# Patient Record
Sex: Female | Born: 1965 | Race: White | Hispanic: No | Marital: Married | Smoking: Never smoker
Health system: Southern US, Community
[De-identification: ages and names within clinical notes are randomized; demographics above are authoritative.]

## PROBLEM LIST (undated history)

## (undated) DIAGNOSIS — F419 Anxiety disorder, unspecified: Secondary | ICD-10-CM

## (undated) DIAGNOSIS — M1990 Unspecified osteoarthritis, unspecified site: Secondary | ICD-10-CM

## (undated) DIAGNOSIS — M329 Systemic lupus erythematosus, unspecified: Secondary | ICD-10-CM

## (undated) DIAGNOSIS — IMO0002 Reserved for concepts with insufficient information to code with codable children: Secondary | ICD-10-CM

## (undated) DIAGNOSIS — R002 Palpitations: Secondary | ICD-10-CM

## (undated) HISTORY — DX: Unspecified osteoarthritis, unspecified site: M19.90

## (undated) HISTORY — DX: Anxiety disorder, unspecified: F41.9

## (undated) HISTORY — DX: Reserved for concepts with insufficient information to code with codable children: IMO0002

## (undated) HISTORY — DX: Systemic lupus erythematosus, unspecified: M32.9

## (undated) HISTORY — DX: Palpitations: R00.2

---

## 1992-04-02 HISTORY — PX: NASAL SINUS SURGERY: SHX719

## 2001-04-02 HISTORY — PX: OTHER SURGICAL HISTORY: SHX169

## 2005-09-26 ENCOUNTER — Ambulatory Visit (HOSPITAL_COMMUNITY): Admission: RE | Admit: 2005-09-26 | Discharge: 2005-09-26 | Payer: Self-pay | Admitting: Obstetrics and Gynecology

## 2005-09-27 ENCOUNTER — Encounter: Admission: RE | Admit: 2005-09-27 | Discharge: 2005-09-27 | Payer: Self-pay | Admitting: Obstetrics and Gynecology

## 2005-11-19 ENCOUNTER — Encounter: Admission: RE | Admit: 2005-11-19 | Discharge: 2005-11-19 | Payer: Self-pay | Admitting: Gastroenterology

## 2006-03-10 ENCOUNTER — Encounter: Admission: RE | Admit: 2006-03-10 | Discharge: 2006-03-10 | Payer: Self-pay | Admitting: Rheumatology

## 2006-11-06 ENCOUNTER — Encounter: Admission: RE | Admit: 2006-11-06 | Discharge: 2006-11-06 | Payer: Self-pay | Admitting: Obstetrics and Gynecology

## 2006-11-14 ENCOUNTER — Encounter: Admission: RE | Admit: 2006-11-14 | Discharge: 2006-11-14 | Payer: Self-pay | Admitting: Obstetrics and Gynecology

## 2007-06-14 ENCOUNTER — Emergency Department (HOSPITAL_COMMUNITY): Admission: EM | Admit: 2007-06-14 | Discharge: 2007-06-14 | Payer: Self-pay | Admitting: Emergency Medicine

## 2007-08-05 ENCOUNTER — Encounter: Admission: RE | Admit: 2007-08-05 | Discharge: 2007-08-05 | Payer: Self-pay | Admitting: Obstetrics and Gynecology

## 2007-12-11 ENCOUNTER — Encounter: Admission: RE | Admit: 2007-12-11 | Discharge: 2007-12-11 | Payer: Self-pay | Admitting: Rheumatology

## 2009-02-01 ENCOUNTER — Encounter: Payer: Self-pay | Admitting: Pulmonary Disease

## 2009-02-18 ENCOUNTER — Ambulatory Visit (HOSPITAL_COMMUNITY): Admission: RE | Admit: 2009-02-18 | Discharge: 2009-02-18 | Payer: Self-pay | Admitting: Rheumatology

## 2009-02-18 ENCOUNTER — Encounter: Payer: Self-pay | Admitting: Pulmonary Disease

## 2009-03-11 ENCOUNTER — Ambulatory Visit: Payer: Self-pay | Admitting: Pulmonary Disease

## 2009-03-11 DIAGNOSIS — R51 Headache: Secondary | ICD-10-CM

## 2009-03-11 DIAGNOSIS — M329 Systemic lupus erythematosus, unspecified: Secondary | ICD-10-CM

## 2009-03-11 DIAGNOSIS — R519 Headache, unspecified: Secondary | ICD-10-CM | POA: Insufficient documentation

## 2009-03-14 DIAGNOSIS — R930 Abnormal findings on diagnostic imaging of skull and head, not elsewhere classified: Secondary | ICD-10-CM | POA: Insufficient documentation

## 2010-01-26 ENCOUNTER — Ambulatory Visit (HOSPITAL_COMMUNITY): Admission: RE | Admit: 2010-01-26 | Discharge: 2010-01-26 | Payer: Self-pay | Admitting: Obstetrics and Gynecology

## 2010-03-01 ENCOUNTER — Encounter: Admission: RE | Admit: 2010-03-01 | Discharge: 2010-03-01 | Payer: Self-pay | Admitting: Obstetrics and Gynecology

## 2010-03-06 ENCOUNTER — Encounter: Admission: RE | Admit: 2010-03-06 | Discharge: 2010-03-06 | Payer: Self-pay | Admitting: Obstetrics and Gynecology

## 2010-04-24 ENCOUNTER — Encounter: Payer: Self-pay | Admitting: Obstetrics and Gynecology

## 2010-06-14 LAB — CBC
HCT: 35.5 % — ABNORMAL LOW (ref 36.0–46.0)
Hemoglobin: 12.4 g/dL (ref 12.0–15.0)
RBC: 3.58 MIL/uL — ABNORMAL LOW (ref 3.87–5.11)

## 2010-06-14 LAB — PREGNANCY, URINE: Preg Test, Ur: NEGATIVE

## 2010-12-25 LAB — COMPREHENSIVE METABOLIC PANEL
ALT: 16
AST: 18
Albumin: 4.2
Alkaline Phosphatase: 46
Calcium: 9.5
GFR calc Af Amer: >60
Glucose, Bld: 95
Potassium: 3.8
Sodium: 139
Total Protein: 7.1

## 2010-12-25 LAB — URINALYSIS, ROUTINE W REFLEX MICROSCOPIC
Nitrite: NEGATIVE
Specific Gravity, Urine: 1.018
Urobilinogen, UA: 0.2
pH: 7.5

## 2010-12-25 LAB — DIFFERENTIAL
Basophils Relative: 0
Eosinophils Absolute: 0.1
Eosinophils Relative: 1
Lymphs Abs: 1.5
Monocytes Absolute: 0.7
Monocytes Relative: 8
Neutrophils Relative %: 76

## 2010-12-25 LAB — CBC
Hemoglobin: 13.9
MCHC: 33.4
RBC: 4.44
RDW: 13.1

## 2010-12-25 LAB — PREGNANCY, URINE: Preg Test, Ur: NEGATIVE

## 2010-12-25 LAB — URINE MICROSCOPIC-ADD ON

## 2012-02-19 ENCOUNTER — Other Ambulatory Visit (HOSPITAL_COMMUNITY): Payer: Self-pay | Admitting: Cardiovascular Disease

## 2012-02-19 DIAGNOSIS — R079 Chest pain, unspecified: Secondary | ICD-10-CM

## 2012-02-26 ENCOUNTER — Ambulatory Visit (HOSPITAL_COMMUNITY)
Admission: RE | Admit: 2012-02-26 | Discharge: 2012-02-26 | Disposition: A | Discharge status: Home / Self Care | Payer: BC Managed Care – PPO | Source: Ambulatory Visit | Attending: Cardiovascular Disease | Admitting: Cardiovascular Disease

## 2012-02-26 DIAGNOSIS — R0789 Other chest pain: Secondary | ICD-10-CM | POA: Insufficient documentation

## 2012-02-26 DIAGNOSIS — R079 Chest pain, unspecified: Secondary | ICD-10-CM

## 2012-02-26 HISTORY — PX: TRANSTHORACIC ECHOCARDIOGRAM: SHX275

## 2012-02-26 NOTE — Progress Notes (Signed)
2D Echo Performed 02/26/2012    Julie Combs, RCS  

## 2012-03-05 ENCOUNTER — Encounter (HOSPITAL_COMMUNITY): Payer: Self-pay

## 2012-03-17 HISTORY — PX: OTHER SURGICAL HISTORY: SHX169

## 2012-11-27 ENCOUNTER — Other Ambulatory Visit: Payer: Self-pay | Admitting: Internal Medicine

## 2012-11-27 ENCOUNTER — Ambulatory Visit
Admission: RE | Admit: 2012-11-27 | Discharge: 2012-11-27 | Disposition: A | Discharge status: Home / Self Care | Payer: BC Managed Care – PPO | Source: Ambulatory Visit | Attending: Internal Medicine | Admitting: Internal Medicine

## 2012-11-27 DIAGNOSIS — S0990XA Unspecified injury of head, initial encounter: Secondary | ICD-10-CM

## 2014-04-15 ENCOUNTER — Telehealth: Payer: Self-pay | Admitting: Cardiovascular Disease

## 2014-04-15 ENCOUNTER — Ambulatory Visit (INDEPENDENT_AMBULATORY_CARE_PROVIDER_SITE_OTHER): Payer: BC Managed Care – PPO | Admitting: Family Medicine

## 2014-04-15 VITALS — BP 134/91 | HR 99 | Temp 98.1°F | Resp 16

## 2014-04-15 DIAGNOSIS — I4901 Ventricular fibrillation: Secondary | ICD-10-CM

## 2014-04-15 DIAGNOSIS — I498 Other specified cardiac arrhythmias: Secondary | ICD-10-CM

## 2014-04-15 DIAGNOSIS — I493 Ventricular premature depolarization: Secondary | ICD-10-CM

## 2014-04-15 LAB — COMPREHENSIVE METABOLIC PANEL
ALBUMIN: 4 g/dL (ref 3.5–5.2)
ALT: 12 U/L (ref 0–35)
AST: 15 U/L (ref 0–37)
Alkaline Phosphatase: 43 U/L (ref 39–117)
BUN: 21 mg/dL (ref 6–23)
CALCIUM: 9.3 mg/dL (ref 8.4–10.5)
CO2: 29 mEq/L (ref 19–32)
Chloride: 104 mEq/L (ref 96–112)
Creat: 0.67 mg/dL (ref 0.50–1.10)
Glucose, Bld: 98 mg/dL (ref 70–99)
POTASSIUM: 4.6 meq/L (ref 3.5–5.3)
Sodium: 139 mEq/L (ref 135–145)
Total Bilirubin: 0.4 mg/dL (ref 0.2–1.2)
Total Protein: 6.8 g/dL (ref 6.0–8.3)

## 2014-04-15 LAB — POCT CBC
GRANULOCYTE PERCENT: 68.9 % (ref 37–80)
HCT, POC: 40.9 % (ref 37.7–47.9)
HEMOGLOBIN: 13.1 g/dL (ref 12.2–16.2)
Lymph, poc: 1.3 (ref 0.6–3.4)
MCH: 29.7 pg (ref 27–31.2)
MCHC: 32.1 g/dL (ref 31.8–35.4)
MCV: 92.3 fL (ref 80–97)
MID (cbc): 0.3 (ref 0–0.9)
MPV: 7.7 fL (ref 0–99.8)
PLATELET COUNT, POC: 248 10*3/uL (ref 142–424)
POC Granulocyte: 3.7 (ref 2–6.9)
POC LYMPH PERCENT: 24.9 %L (ref 10–50)
POC MID %: 6.5 % (ref 0–12)
RBC: 4.43 M/uL (ref 4.04–5.48)
RDW, POC: 14.4 %
WBC: 5.4 10*3/uL (ref 4.6–10.2)

## 2014-04-15 LAB — GLUCOSE, POCT (MANUAL RESULT ENTRY): POC Glucose: 114 mg/dl — AB (ref 70–99)

## 2014-04-15 MED ORDER — ATENOLOL 25 MG PO TABS: 12.5000 mg | ORAL_TABLET | Freq: Every day | ORAL | Status: DC

## 2014-04-15 NOTE — Telephone Encounter (Signed)
Close encounter 

## 2014-04-15 NOTE — Patient Instructions (Addendum)
It appears that you are having "PVCs," these are harmless early heartbeats.  Please give your heart doctor a call (it looks like you have seen Dr. Gwenlyn Found in the past) and schedule an appointment: I will put in a referral for you since you have not been seen in a while.  I will be in touch with the rest of your labs asap.  Take care and please let me know if your symptoms get worse.  In the meantime try to get enough rest and limit caffeine or any other potential stimulants (sudafed, other cold medications)    [place] [facility]  [address]  [place], [place] [address]   Be sure that you stay well hydrated.   Take the atenolol 12.5 once a day- this will help to suppress your early heartbeats.    If you have chest pain, severe dizziness or any other problems go to the ER

## 2014-04-15 NOTE — Progress Notes (Addendum)
[facility] [address],  [place] [address] [phone]  Date:  04/15/2014   Name:  Julie Combs   DOB:  03/15/1966   MRN:  [id number]  PCP:  No primary care provider on file.    Chief Complaint: Irregular Heart Beat   History of Present Illness:  Julie Combs is a 49 y.o. very pleasant female patient who presents with the following:  She notes her "heart flutteing" over the last couple of days.  It is sometimes more noticeable and persistent, will come and go.  She is not having any chest pain, no syncope.  However she has felt a bit dizzy at times.    She had some heart concern and was seen by Dr. Gwenlyn Found a couple of years ago- she wore an event monitor and had an echo.  All was reassuring and she did not have to follow-up  She has lupus and uses plaquenil, arava.  OW she is generally in excellent health and is physically active.  No new meds, she has been using less caffiene than usual No SOB  LMP was in December- she is s/p BTL She has never been a smoker, here with her husband today   Patient Active Problem List   Diagnosis Date Noted  . Nonspecific (abnormal) findings on radiological and other examination of body structure 03/14/2009  . ABNORMAL CHEST XRAY 03/14/2009  . LUPUS 03/11/2009  . HEADACHE, CHRONIC 03/11/2009    Past Medical History  Diagnosis Date  . Anxiety   . Lupus     No past surgical history on file.  History  Substance Use Topics  . Smoking status: Never Smoker   . Smokeless tobacco: Not on file  . Alcohol Use: Not on file    No family history on file.  Allergies  Allergen Reactions  . Tetanus Toxoid     Medication list has been reviewed and updated.  No current outpatient prescriptions on file prior to visit.   No current facility-administered medications on file prior to visit.    Review of Systems:  As per HPI- otherwise negative.   Physical Examination: Filed Vitals:   04/15/14  1325  BP: 156/90  Pulse: 94  Temp: 98.1 F (36.7 C)  Resp: 16   There were no vitals filed for this visit. There is no weight on file to calculate BMI. Ideal Body Weight:    GEN: WDWN, NAD, Non-toxic, A & O x 3, slim, looks well HEENT: Atraumatic, Normocephalic. Neck supple. No masses, No LAD. Ears and Nose: No external deformity. CV: RRR with periods of frequent missed or early beats typical of PVCs, No M/G/R. No JVD. No thrill. No extra heart sounds. PULM: CTA B, no wheezes, crackles, rhonchi. No retractions. No resp. distress. No accessory muscle use. ABD: S, NT, ND, +BS. No rebound. No HSM. EXTR: No c/c/e NEURO Normal gait.  PSYCH: Normally interactive. Conversant. Not depressed or anxious appearing.  Calm demeanor.   EKG: 12 lead is normal, rhythm strip reveals several PVCs  Results for orders placed or performed in visit on 04/15/14  POCT CBC  Result Value Ref Range   WBC 5.4 4.6 - 10.2 K/uL   Lymph, poc 1.3 0.6 - 3.4   POC LYMPH PERCENT 24.9 10 - 50 %L   MID (cbc) 0.3 0 - 0.9   POC MID % 6.5 0 - 12 %M   POC Granulocyte 3.7 2 - 6.9   Granulocyte percent 68.9 37 -  80 %G   RBC 4.43 4.04 - 5.48 M/uL   Hemoglobin 13.1 12.2 - 16.2 g/dL   HCT, POC 40.9 37.7 - 47.9 %   MCV 92.3 80 - 97 fL   MCH, POC 29.7 27 - 31.2 pg   MCHC 32.1 31.8 - 35.4 g/dL   RDW, POC 14.4 %   Platelet Count, POC 248 142 - 424 K/uL   MPV 7.7 0 - 99.8 fL  POCT glucose (manual entry)  Result Value Ref Range   POC Glucose 114 (A) 70 - 99 mg/dl   See orthostatic values.  She is NOT fasting currently  Assessment and Plan: PVC (premature ventricular contraction)  Fluttering heart - Plan: EKG 12-Lead, POCT CBC, POCT glucose (manual entry), Comprehensive metabolic panel, TSH, atenolol (TENORMIN) 25 MG tablet, Ambulatory referral to Cardiology  Julie Combs is here today with palpitations due to PVCs.  She will call her cardiologist and schedule a follow-up appt, placed referral as she has not been seen  in a couple of years.  Start a low dose of atenolol to suppress her palpitations and await the rest of her labs.   She will seek care right away if any change in her sx  Signed Lamar Blinks, MD  1/15:Received labs, all normal.  Mail copy to pt, called and LMOM with this information

## 2014-04-16 ENCOUNTER — Encounter: Payer: Self-pay | Admitting: Family Medicine

## 2014-04-16 LAB — TSH: TSH: 0.898 u[IU]/mL (ref 0.350–4.500)

## 2014-04-30 ENCOUNTER — Encounter: Payer: Self-pay | Admitting: *Deleted

## 2014-05-04 ENCOUNTER — Ambulatory Visit (INDEPENDENT_AMBULATORY_CARE_PROVIDER_SITE_OTHER): Payer: BC Managed Care – PPO | Admitting: Cardiology

## 2014-05-04 ENCOUNTER — Encounter: Payer: Self-pay | Admitting: Cardiology

## 2014-05-04 VITALS — BP 112/74 | HR 75 | Ht 64.0 in | Wt 133.0 lb

## 2014-05-04 DIAGNOSIS — R002 Palpitations: Secondary | ICD-10-CM | POA: Diagnosis not present

## 2014-05-04 NOTE — Patient Instructions (Signed)
Julie Simmons, PA-C, recommends that you schedule a follow-up appointment in 4-5 weeks with Dr Gwenlyn Found.  Your physician has ordered you to wear a heart monitor. You will wear this for 30 days.   TIPS -  REMINDERS 1. The sensor is the lanyard that is worn around your neck every day - this is powered by a battery that needs to be changed every day 2. The monitor is the device that allows you to record symptoms - this will need to be charged daily 3. The sensor & monitor need to be within 100 feet of each other at all times 4. The sensor connects to the electrodes (stickers) - these should be changed every 24-48 hours (you do not have to remove them when you bathe, just make sure they are dry when you connect it back to the sensor 5. If you need more supplies (electrodes, batteries), please call the 1-800 # on the back of the pamphlet and [employer] will mail you more supplies 6. If your skin becomes sensitive, please try the sample pack of sensitive skin electrodes (the white packet in your silver box) and call [employer] to have them mail you more of these type of electrodes 7. When you are finish wearing the monitor, please place all supplies back in the silver box, place the silver box in the pre-packaged UPS bag and drop off at [employer] or call them so they can come pick it up   Cardiac Event Monitoring A cardiac event monitor is a small recording device used to help detect abnormal heart rhythms (arrhythmias). The monitor is used to record heart rhythm when noticeable symptoms such as the following occur:  Fast heartbeats (palpitations), such as heart racing or fluttering.  Dizziness.  Fainting or light-headedness.  Unexplained weakness. The monitor is wired to two electrodes placed on your chest. Electrodes are flat, sticky disks that attach to your skin. The monitor can be worn for up to 30 days. You will wear the monitor at all times, except when bathing.  HOW TO USE YOUR CARDIAC EVENT  MONITOR A technician will prepare your chest for the electrode placement. The technician will show you how to place the electrodes, how to work the monitor, and how to replace the batteries. Take time to practice using the monitor before you leave the office. Make sure you understand how to send the information from the monitor to your health care provider. This requires a telephone with a landline, not a cell phone. You need to:  Wear your monitor at all times, except when you are in water:  Do not get the monitor wet.  Take the monitor off when bathing. Do not swim or use a hot tub with it on.  Keep your skin clean. Do not put body lotion or moisturizer on your chest.  Change the electrodes daily or any time they stop sticking to your skin. You might need to use tape to keep them on.  It is possible that your skin under the electrodes could become irritated. To keep this from happening, try to put the electrodes in slightly different places on your chest. However, they must remain in the area under your left breast and in the upper right section of your chest.  Make sure the monitor is safely clipped to your clothing or in a location close to your body that your health care provider recommends.  Press the button to record when you feel symptoms of heart trouble, such as dizziness, weakness, light-headedness,  palpitations, thumping, shortness of breath, unexplained weakness, or a fluttering or racing heart. The monitor is always on and records what happened slightly before you pressed the button, so do not worry about being too late to get good information.  Keep a diary of your activities, such as walking, doing chores, and taking medicine. It is especially important to note what you were doing when you pushed the button to record your symptoms. This will help your health care provider determine what might be contributing to your symptoms. The information stored in your monitor will be reviewed  by your health care provider alongside your diary entries.  Send the recorded information as recommended by your health care provider. It is important to understand that it will take some time for your health care provider to process the results.  Change the batteries as recommended by your health care provider. SEEK IMMEDIATE MEDICAL CARE IF:   You have chest pain.  You have extreme difficulty breathing or shortness of breath.  You develop a very fast heartbeat that persists.  You develop dizziness that does not go away.  You faint or constantly feel you are about to faint. Document Released: 12/27/2007 Document Revised: 08/03/2013 Document Reviewed: 09/15/2012 [facility] Patient Information 2015 [place], [place]. This information is not intended to replace advice given to you by your health care provider. Make sure you discuss any questions you have with your health care provider.

## 2014-05-04 NOTE — Progress Notes (Signed)
Cardiology Office Note   Date:  05/04/2014   ID:  Julie Combs, DOB 1966/02/16, MRN [id number]  PCP:  Horatio Pel, MD  Cardiologist:  Dr. Gwenlyn Found  Chief Complaint  Patient presents with  . Palpitations    has improved since urgent care added atenolol      History of Present Illness: Julie Combs is a 49 y.o. female who presents to clinic for evaluation for palpitations. She has been seen in the past by Dr. Gwenlyn Found. She was seen in 2012 for evaluation of palpitations. Cardiac monitor during that time revealed no significant findings other than benign PVCs. Stress test and 2-D echocardiogram during that time also revealed no ischemia and normal systolic function. Her past medical history is significant for lupus with mostly skin and articular manifestations. She denies any awareness of any other organ involvement.   She was recently evaluated at an urgent care on 04/15/13 for palpitations and was noted to have PVCs on EKG. She reports noticing increased recurrence of palpitations. She reports her current symptoms are different from the palpitations she experienced in 2012 when she was evaluated by Dr. Gwenlyn Found. Her most recent episodes have been more constant and have lasted several hours. She notes some shortness of breath but denies dizziness, lightheadedness, syncope/near-syncope. No chest discomfort. She denies any recent consumption of excessice caffeine or alcohol. No over-the-counter medicines or stimulants. At the urgent care, TSH was checked and was normal. She was prescribed atenolol. Since starting the medication she has noticed some improvement with reduced symptoms but continues to have occasional recurrences of palpitations.    Past Medical History  Diagnosis Date  . Anxiety   . Lupus     Past Surgical History  Procedure Laterality Date  . Transthoracic echocardiogram  02/26/12    EF 55%-60%.NORMAL STUDY.  Marland Kitchen Cpx  03/17/12    FUNCTIONAL  STATUS:[id number] VO2=75% OF PREDICTED.CV STATUS:IMPAIRED.SUBOPTIMAL PEAK CV STRESS LOAD.LOW AT IS SUGGESTIVE OF IMPAIRED CARDIAC FUNCTION.PULMONARY STATUS:NOT IMPAIRED.LIMITING FACTOR=CARDIAC + EFFORT.     Current Outpatient Prescriptions  Medication Sig Dispense Refill  . atenolol (TENORMIN) 25 MG tablet Take 0.5 tablets (12.5 mg total) by mouth daily. 30 tablet 1  . citalopram (CELEXA) 20 MG tablet Take 20 mg by mouth daily.    . hydroxychloroquine (PLAQUENIL) 200 MG tablet Take by mouth daily.    Marland Kitchen leflunomide (ARAVA) 20 MG tablet Take 20 mg by mouth daily.     No current facility-administered medications for this visit.    Allergies:   Tetanus toxoid    Social History:  The patient  reports that she has never smoked. She does not have any smokeless tobacco history on file.   Family History:  The patient's family history is significant for atrial fibrillation in grandmother  ROS:  Please see the history of present illness.   Otherwise, review of systems are positive for palpitations.   All other systems are reviewed and negative.    PHYSICAL EXAM: VS:  BP 112/74 mmHg  Pulse 75  Ht 5\' 4"  (1.626 m)  Wt 133 lb (60.328 kg)  BMI 22.82 kg/m2  LMP 03/02/2014 (Approximate) , BMI Body mass index is 22.82 kg/(m^2). GEN: Well nourished, well developed, in no acute distress HEENT: normal Neck: no JVD, carotid bruits, or masses Cardiac: RRR; no murmurs, rubs, or gallops,no edema  Respiratory:  clear to auscultation bilaterally, normal work of breathing GI: soft, nontender, nondistended, + BS MS: no deformity or atrophy Skin: warm and dry, no rash Neuro:  Strength and sensation are intact Psych: euthymic mood, full affect   EKG:  EKG is ordered today. The ekg ordered today demonstrates NSR with sinus arrhthymias. HR 75 bpm    Recent Labs: 04/15/2014: ALT 12; BUN 21; Creatinine 0.67; Hemoglobin 13.1; Potassium 4.6; Sodium 139; TSH 0.898    Lipid Panel No results found for:  CHOL, TRIG, HDL, CHOLHDL, VLDL, LDLCALC, LDLDIRECT    Wt Readings from Last 3 Encounters:  05/04/14 133 lb (60.328 kg)  03/11/09 127 lb (57.607 kg)      Other studies Reviewed: Additional studies/ records that were reviewed today include: prior 2D echo. Recent thyroid studies Review of the above records demonstrates: normal past cardiac studies/ normal thyroid function test   ASSESSMENT AND PLAN:  1.  Palpitations: Recent EKG at urgent care revealed sinus rhythm with frequent PVCs. TSH was checked and was also normal. She has noticed moderate improvement with atenolol but continues to have occasional episodes of recurrent palpitations. Her EKG today demonstrates arhythmia. Rate is  75 bpm. She was reassured that PVCs are benign. However, she has concerns regarding her lupus and the possibility for cardiac involvement. For reassurance, she wishes to be further evaluated with cardiac event monitoring to ensure that no serious arrhythmias are occurring. We will evaluate her with a 30 day heart monitor. She has been advised at to refrain from triggers including excessive caffeine or alcohol intake. Follow-up with Dr. Gwenlyn Found in 4-5 weeks for reassessment and to review cardiac monitor results.    Current medicines are reviewed at length with the patient today.  The patient does not have concerns regarding medicines.  The following changes have been made:  Will order 30 day event monitor  Labs/ tests ordered today include: Ambulatory cardiac event monitor    Disposition:   FU with Dr. Gwenlyn Found in 5 weeks   Signed, Lyda Jester, PA-C  05/04/2014 [address], [place], [place]  [address] Phone: [phone]; Fax: [phone]

## 2014-05-28 ENCOUNTER — Telehealth: Payer: Self-pay | Admitting: Cardiovascular Disease

## 2014-05-28 NOTE — Telephone Encounter (Signed)
Pt said that she has sensitive skin stickers and her skin is still extremely irritated. She has a raised, red rash. She said the rash and irritation is keeping her up at night. She wants to know if we have enough information. Pt states she has taken the monitor off and cannot continue to wear it because of the irritation, itching and rash.

## 2014-05-28 NOTE — Telephone Encounter (Signed)
Altha Harm is calling because she has been wearing a 30 monitor and it has given her a horrible rash and she does not have it on today . Wants to know if she can stop wearing it . Please call   Thanks

## 2014-06-03 ENCOUNTER — Telehealth: Payer: Self-pay | Admitting: *Deleted

## 2014-06-03 NOTE — Telephone Encounter (Signed)
Per [employer] report, patient was to finish monitor on 06/02/14. Assume she continued to wear monitor despite the skin irritation?  Patient also has OV with Dr. Gwenlyn Found 3/16 and 3/18 - may need to clarify

## 2014-06-03 NOTE — Telephone Encounter (Signed)
Contacted patient because she has 2 appointments scheduled to see Dr. Gwenlyn Found.  Spoke to patient and she stated that she only had the one appointment on the 18th written down, and that was best for her.  I will make sure her 3/16 appointment is cancelled.

## 2014-06-03 NOTE — Telephone Encounter (Signed)
Pt unable to wear event monitor. PA who ordered the monitor is aware. Pt has appointment with Dr. Gwenlyn Found on 3/18. 3/16 appt has been cancelled.

## 2014-06-16 ENCOUNTER — Ambulatory Visit: Payer: BC Managed Care – PPO | Admitting: Cardiovascular Disease

## 2014-06-18 ENCOUNTER — Encounter: Payer: Self-pay | Admitting: Cardiovascular Disease

## 2014-06-18 ENCOUNTER — Ambulatory Visit (INDEPENDENT_AMBULATORY_CARE_PROVIDER_SITE_OTHER): Payer: BC Managed Care – PPO | Admitting: Cardiovascular Disease

## 2014-06-18 VITALS — BP 118/76 | HR 80 | Ht 64.0 in | Wt 136.7 lb

## 2014-06-18 DIAGNOSIS — R002 Palpitations: Secondary | ICD-10-CM | POA: Insufficient documentation

## 2014-06-18 NOTE — Patient Instructions (Signed)
Follow up with Dr Berry as needed.  

## 2014-06-18 NOTE — Progress Notes (Signed)
06/18/2014 Julie Combs   February 26, 1966  [id number]  Primary Physician Horatio Pel, MD Primary Cardiologist: Lorretta Harp MD Julie Combs   HPI:  Mrs. 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. I initially saw her back in 2013 for palpitations. She is a primary care  patient of Dr. Katrine Coho  at [facility]. She has had a normal 2-D echo and exercise stress test back in 2013. She has been complaining of increasing frequency of palpitations back in late January or early February and saw Julie Henri PA-C in our practice for evaluation. A event monitor showed unifocal PVCs. She was placed on low-dose beta blocker which she has since discontinued. Palpitations have somewhat improved. She does admit to drinking one cup of caffeinated coffee a day. Her history otherwise is remarkable for SLE on suppressive therapy.   Current Outpatient Prescriptions  Medication Sig Dispense Refill  . atenolol (TENORMIN) 25 MG tablet Take 0.5 tablets (12.5 mg total) by mouth daily. 30 tablet 1  . citalopram (CELEXA) 20 MG tablet Take 20 mg by mouth daily.    . hydroxychloroquine (PLAQUENIL) 200 MG tablet Take by mouth daily.    Marland Kitchen leflunomide (ARAVA) 20 MG tablet Take 20 mg by mouth daily.     No current facility-administered medications for this visit.    Allergies  Allergen Reactions  . Tetanus Toxoid     Swelling, high fever    History   Social History  . Marital Status: Married    Spouse Name: N/A  . Number of Children: N/A  . Years of Education: N/A   Occupational History  . Not on file.   Social History Main Topics  . Smoking status: Never Smoker   . Smokeless tobacco: Not on file  . Alcohol Use: Not on file  . Drug Use: Not on file  . Sexual Activity: Not on file   Other Topics Concern  . Not on file   Social History Narrative      Review of Systems: General: negative for chills, fever, night sweats or weight changes.  Cardiovascular: negative for chest pain, dyspnea on exertion, edema, orthopnea, palpitations, paroxysmal nocturnal dyspnea or shortness of breath Dermatological: negative for rash Respiratory: negative for cough or wheezing Urologic: negative for hematuria Abdominal: negative for nausea, vomiting, diarrhea, bright red blood per rectum, melena, or hematemesis Neurologic: negative for visual changes, syncope, or dizziness All other systems reviewed and are otherwise negative except as noted above.    Blood pressure 118/76, pulse 80, height 5\' 4"  (1.626 m), weight 136 lb 11.2 oz (62.007 kg).  General appearance: alert and no distress Neck: no adenopathy, no carotid bruit, no JVD, supple, symmetrical, trachea midline and thyroid not enlarged, symmetric, no tenderness/mass/nodules Lungs: clear to auscultation bilaterally Heart: regular rate and rhythm, S1, S2 normal, no murmur, click, rub or gallop Extremities: extremities normal, atraumatic, no cyanosis or edema  EKG not performed today  ASSESSMENT AND PLAN:   Palpitations Patient has a long history of palpitations dating back to her first office visit with me in 2013. At that time an exercise stress test and 2-D echo were normal. He does have a history of systemic lupus erythematosus which is relatively quiescent. Recent event monitor showed unifocal PVCs which improved somewhat with low-dose beta blocker which she has since discontinued. Her thyroid function tests apparently were normal. She does drink one  cup of caffeinated coffee a day. Her palpitations have gotten somewhat better since she saw [place] PA-C 6 weeks ago. I reassured her that these are benign with no prognostic significance. She prefers to be off of beta blocker. I will see her back when necessary.       Lorretta Harp MD FACP,FACC,FAHA, [facility] 06/18/2014 10:59 AM

## 2014-06-18 NOTE — Assessment & Plan Note (Signed)
Patient has a long history of palpitations dating back to her first office visit with me in 2013. At that time an exercise stress test and 2-D echo were normal. He does have a history of systemic lupus erythematosus which is relatively quiescent. Recent event monitor showed unifocal PVCs which improved somewhat with low-dose beta blocker which she has since discontinued. Her thyroid function tests apparently were normal. She does drink one cup of caffeinated coffee a day. Her palpitations have gotten somewhat better since she saw [place] PA-C 6 weeks ago. I reassured her that these are benign with no prognostic significance. She prefers to be off of beta blocker. I will see her back when necessary.

## 2014-12-10 ENCOUNTER — Ambulatory Visit (INDEPENDENT_AMBULATORY_CARE_PROVIDER_SITE_OTHER): Payer: BC Managed Care – PPO | Admitting: Podiatry

## 2014-12-10 ENCOUNTER — Encounter: Payer: Self-pay | Admitting: Podiatry

## 2014-12-10 ENCOUNTER — Ambulatory Visit (INDEPENDENT_AMBULATORY_CARE_PROVIDER_SITE_OTHER): Payer: BC Managed Care – PPO

## 2014-12-10 VITALS — BP 89/56 | HR 63 | Resp 16 | Ht 64.0 in | Wt 132.0 lb

## 2014-12-10 DIAGNOSIS — M79672 Pain in left foot: Secondary | ICD-10-CM | POA: Diagnosis not present

## 2014-12-10 DIAGNOSIS — M25571 Pain in right ankle and joints of right foot: Secondary | ICD-10-CM

## 2014-12-10 DIAGNOSIS — M779 Enthesopathy, unspecified: Secondary | ICD-10-CM | POA: Diagnosis not present

## 2014-12-10 MED ORDER — TRIAMCINOLONE ACETONIDE 10 MG/ML IJ SUSP
10.0000 mg | Freq: Once | INTRAMUSCULAR | Status: AC
  Administered 2014-12-10: 10 mg

## 2014-12-10 NOTE — Patient Instructions (Addendum)
Achilles Tendinitis  with Rehab Achilles tendinitis is a disorder of the Achilles tendon. The Achilles tendon connects the large calf muscles (Gastrocnemius and Soleus) to the heel bone (calcaneus). This tendon is sometimes called the heel cord. It is important for pushing-off and standing on your toes and is important for walking, running, or jumping. Tendinitis is often caused by overuse and repetitive microtrauma. SYMPTOMS  Pain, tenderness, swelling, warmth, and redness may occur over the Achilles tendon even at rest.  Pain with pushing off, or flexing or extending the ankle.  Pain that is worsened after or during activity. CAUSES   Overuse sometimes seen with rapid increase in exercise programs or in sports requiring running and jumping.  Poor physical conditioning (strength and flexibility or endurance).  Running sports, especially training running down hills.  Inadequate warm-up before practice or play or failure to stretch before participation.  Injury to the tendon. PREVENTION   Warm up and stretch before practice or competition.  Allow time for adequate rest and recovery between practices and competition.  Keep up conditioning.  Keep up ankle and leg flexibility.  Improve or keep muscle strength and endurance.  Improve cardiovascular fitness.  Use proper technique.  Use proper equipment (shoes, skates).  To help prevent recurrence, taping, protective strapping, or an adhesive bandage may be recommended for several weeks after healing is complete. PROGNOSIS   Recovery may take weeks to several months to heal.  Longer recovery is expected if symptoms have been prolonged.  Recovery is usually quicker if the inflammation is due to a direct blow as compared with overuse or sudden strain. RELATED COMPLICATIONS   Healing time will be prolonged if the condition is not correctly treated. The injury must be given plenty of time to heal.  Symptoms can reoccur if  activity is resumed too soon.  Untreated, tendinitis may increase the risk of tendon rupture requiring additional time for recovery and possibly surgery. TREATMENT   The first treatment consists of rest anti-inflammatory medication, and ice to relieve the pain.  Stretching and strengthening exercises after resolution of pain will likely help reduce the risk of recurrence. Referral to a physical therapist or athletic trainer for further evaluation and treatment may be helpful.  A walking boot or cast may be recommended to rest the Achilles tendon. This can help break the cycle of inflammation and microtrauma.  Arch supports (orthotics) may be prescribed or recommended by your caregiver as an adjunct to therapy and rest.  Surgery to remove the inflamed tendon lining or degenerated tendon tissue is rarely necessary and has shown less than predictable results. MEDICATION   Nonsteroidal anti-inflammatory medications, such as aspirin and ibuprofen, may be used for pain and inflammation relief. Do not take within 7 days before surgery. Take these as directed by your caregiver. Contact your caregiver immediately if any bleeding, stomach upset, or signs of allergic reaction occur. Other minor pain relievers, such as acetaminophen, may also be used.  Pain relievers may be prescribed as necessary by your caregiver. Do not take prescription pain medication for longer than 4 to 7 days. Use only as directed and only as much as you need.  Cortisone injections are rarely indicated. Cortisone injections may weaken tendons and predispose to rupture. It is better to give the condition more time to heal than to use them. HEAT AND COLD  Cold is used to relieve pain and reduce inflammation for acute and chronic Achilles tendinitis. Cold should be applied for 10 to 15 minutes   every 2 to 3 hours for inflammation and pain and immediately after any activity that aggravates your symptoms. Use ice packs or an ice  massage.  Heat may be used before performing stretching and strengthening activities prescribed by your caregiver. Use a heat pack or a warm soak. SEEK MEDICAL CARE IF:  Symptoms get worse or do not improve in 2 weeks despite treatment.  New, unexplained symptoms develop. Drugs used in treatment may produce side effects.  EXERCISES:  RANGE OF MOTION (ROM) AND STRETCHING EXERCISES - Achilles Tendinitis  These exercises may help you when beginning to rehabilitate your injury. Your symptoms may resolve with or without further involvement from your physician, physical therapist or athletic trainer. While completing these exercises, remember:   Restoring tissue flexibility helps normal motion to return to the joints. This allows healthier, less painful movement and activity.  An effective stretch should be held for at least 30 seconds.  A stretch should never be painful. You should only feel a gentle lengthening or release in the stretched tissue.  STRETCH  Gastroc, Standing   Place hands on wall.  Extend right / left leg, keeping the front knee somewhat bent.  Slightly point your toes inward on your back foot.  Keeping your right / left heel on the floor and your knee straight, shift your weight toward the wall, not allowing your back to arch.  You should feel a gentle stretch in the right / left calf. Hold this position for 10 seconds. Repeat 3 times. Complete this stretch 2 times per day.  STRETCH  Soleus, Standing   Place hands on wall.  Extend right / left leg, keeping the other knee somewhat bent.  Slightly point your toes inward on your back foot.  Keep your right / left heel on the floor, bend your back knee, and slightly shift your weight over the back leg so that you feel a gentle stretch deep in your back calf.  Hold this position for 10 seconds. Repeat 3 times. Complete this stretch 2 times per day.  STRETCH  Gastrocsoleus, Standing  Note: This exercise can place  a lot of stress on your foot and ankle. Please complete this exercise only if specifically instructed by your caregiver.   Place the ball of your right / left foot on a step, keeping your other foot firmly on the same step.  Hold on to the wall or a rail for balance.  Slowly lift your other foot, allowing your body weight to press your heel down over the edge of the step.  You should feel a stretch in your right / left calf.  Hold this position for 10 seconds.  Repeat this exercise with a slight bend in your knee. Repeat 3 times. Complete this stretch 2 times per day.   STRENGTHENING EXERCISES - Achilles Tendinitis These exercises may help you when beginning to rehabilitate your injury. They may resolve your symptoms with or without further involvement from your physician, physical therapist or athletic trainer. While completing these exercises, remember:   Muscles can gain both the endurance and the strength needed for everyday activities through controlled exercises.  Complete these exercises as instructed by your physician, physical therapist or athletic trainer. Progress the resistance and repetitions only as guided.  You may experience muscle soreness or fatigue, but the pain or discomfort you are trying to eliminate should never worsen during these exercises. If this pain does worsen, stop and make certain you are following the directions exactly. If   the pain is still present after adjustments, discontinue the exercise until you can discuss the trouble with your clinician.  STRENGTH - Plantar-flexors   Sit with your right / left leg extended. Holding onto both ends of a rubber exercise band/tubing, loop it around the ball of your foot. Keep a slight tension in the band.  Slowly push your toes away from you, pointing them downward.  Hold this position for 10 seconds. Return slowly, controlling the tension in the band/tubing. Repeat 3 times. Complete this exercise 2 times per day.     STRENGTH - Plantar-flexors   Stand with your feet shoulder width apart. Steady yourself with a wall or table using as little support as needed.  Keeping your weight evenly spread over the width of your feet, rise up on your toes.*  Hold this position for 10 seconds. Repeat 3 times. Complete this exercise 2 times per day.  *If this is too easy, shift your weight toward your right / left leg until you feel challenged. Ultimately, you may be asked to do this exercise with your right / left foot only.  STRENGTH  Plantar-flexors, Eccentric  Note: This exercise can place a lot of stress on your foot and ankle. Please complete this exercise only if specifically instructed by your caregiver.   Place the balls of your feet on a step. With your hands, use only enough support from a wall or rail to keep your balance.  Keep your knees straight and rise up on your toes.  Slowly shift your weight entirely to your right / left toes and pick up your opposite foot. Gently and with controlled movement, lower your weight through your right / left foot so that your heel drops below the level of the step. You will feel a slight stretch in the back of your calf at the end position.  Use the healthy leg to help rise up onto the balls of both feet, then lower weight only on the right / left leg again. Build up to 15 repetitions. Then progress to 3 consecutive sets of 15 repetitions.*  After completing the above exercise, complete the same exercise with a slight knee bend (about 30 degrees). Again, build up to 15 repetitions. Then progress to 3 consecutive sets of 15 repetitions.* Perform this exercise 2 times per day.  *When you easily complete 3 sets of 15, your physician, physical therapist or athletic trainer may advise you to add resistance by wearing a backpack filled with additional weight.  STRENGTH - Plantar Flexors, Seated   Sit on a chair that allows your feet to rest flat on the ground. If  necessary, sit at the edge of the chair.  Keeping your toes firmly on the ground, lift your right / left heel as far as you can without increasing any discomfort in your ankle. Repeat 3 times. Complete this exercise 2 times a day. Achilles Tendinitis  with Rehab Achilles tendinitis is a disorder of the Achilles tendon. The Achilles tendon connects the large calf muscles (Gastrocnemius and Soleus) to the heel bone (calcaneus). This tendon is sometimes called the heel cord. It is important for pushing-off and standing on your toes and is important for walking, running, or jumping. Tendinitis is often caused by overuse and repetitive microtrauma. SYMPTOMS  Pain, tenderness, swelling, warmth, and redness may occur over the Achilles tendon even at rest.  Pain with pushing off, or flexing or extending the ankle.  Pain that is worsened after or during activity. CAUSES     Overuse sometimes seen with rapid increase in exercise programs or in sports requiring running and jumping.  Poor physical conditioning (strength and flexibility or endurance).  Running sports, especially training running down hills.  Inadequate warm-up before practice or play or failure to stretch before participation.  Injury to the tendon. PREVENTION   Warm up and stretch before practice or competition.  Allow time for adequate rest and recovery between practices and competition.  Keep up conditioning.  Keep up ankle and leg flexibility.  Improve or keep muscle strength and endurance.  Improve cardiovascular fitness.  Use proper technique.  Use proper equipment (shoes, skates).  To help prevent recurrence, taping, protective strapping, or an adhesive bandage may be recommended for several weeks after healing is complete. PROGNOSIS   Recovery may take weeks to several months to heal.  Longer recovery is expected if symptoms have been prolonged.  Recovery is usually quicker if the inflammation is due to a  direct blow as compared with overuse or sudden strain. RELATED COMPLICATIONS   Healing time will be prolonged if the condition is not correctly treated. The injury must be given plenty of time to heal.  Symptoms can reoccur if activity is resumed too soon.  Untreated, tendinitis may increase the risk of tendon rupture requiring additional time for recovery and possibly surgery. TREATMENT   The first treatment consists of rest anti-inflammatory medication, and ice to relieve the pain.  Stretching and strengthening exercises after resolution of pain will likely help reduce the risk of recurrence. Referral to a physical therapist or athletic trainer for further evaluation and treatment may be helpful.  A walking boot or cast may be recommended to rest the Achilles tendon. This can help break the cycle of inflammation and microtrauma.  Arch supports (orthotics) may be prescribed or recommended by your caregiver as an adjunct to therapy and rest.  Surgery to remove the inflamed tendon lining or degenerated tendon tissue is rarely necessary and has shown less than predictable results. MEDICATION   Nonsteroidal anti-inflammatory medications, such as aspirin and ibuprofen, may be used for pain and inflammation relief. Do not take within 7 days before surgery. Take these as directed by your caregiver. Contact your caregiver immediately if any bleeding, stomach upset, or signs of allergic reaction occur. Other minor pain relievers, such as acetaminophen, may also be used.  Pain relievers may be prescribed as necessary by your caregiver. Do not take prescription pain medication for longer than 4 to 7 days. Use only as directed and only as much as you need.  Cortisone injections are rarely indicated. Cortisone injections may weaken tendons and predispose to rupture. It is better to give the condition more time to heal than to use them. HEAT AND COLD  Cold is used to relieve pain and reduce  inflammation for acute and chronic Achilles tendinitis. Cold should be applied for 10 to 15 minutes every 2 to 3 hours for inflammation and pain and immediately after any activity that aggravates your symptoms. Use ice packs or an ice massage.  Heat may be used before performing stretching and strengthening activities prescribed by your caregiver. Use a heat pack or a warm soak. SEEK MEDICAL CARE IF:  Symptoms get worse or do not improve in 2 weeks despite treatment.  New, unexplained symptoms develop. Drugs used in treatment may produce side effects.  EXERCISES:  RANGE OF MOTION (ROM) AND STRETCHING EXERCISES - Achilles Tendinitis  These exercises may help you when beginning to rehabilitate your injury. Your   symptoms may resolve with or without further involvement from your physician, physical therapist or athletic trainer. While completing these exercises, remember:   Restoring tissue flexibility helps normal motion to return to the joints. This allows healthier, less painful movement and activity.  An effective stretch should be held for at least 30 seconds.  A stretch should never be painful. You should only feel a gentle lengthening or release in the stretched tissue.  STRETCH  Gastroc, Standing   Place hands on wall.  Extend right / left leg, keeping the front knee somewhat bent.  Slightly point your toes inward on your back foot.  Keeping your right / left heel on the floor and your knee straight, shift your weight toward the wall, not allowing your back to arch.  You should feel a gentle stretch in the right / left calf. Hold this position for 10 seconds. Repeat 3 times. Complete this stretch 2 times per day.  STRETCH  Soleus, Standing   Place hands on wall.  Extend right / left leg, keeping the other knee somewhat bent.  Slightly point your toes inward on your back foot.  Keep your right / left heel on the floor, bend your back knee, and slightly shift your weight  over the back leg so that you feel a gentle stretch deep in your back calf.  Hold this position for 10 seconds. Repeat 3 times. Complete this stretch 2 times per day.  STRETCH  Gastrocsoleus, Standing  Note: This exercise can place a lot of stress on your foot and ankle. Please complete this exercise only if specifically instructed by your caregiver.   Place the ball of your right / left foot on a step, keeping your other foot firmly on the same step.  Hold on to the wall or a rail for balance.  Slowly lift your other foot, allowing your body weight to press your heel down over the edge of the step.  You should feel a stretch in your right / left calf.  Hold this position for 10 seconds.  Repeat this exercise with a slight bend in your knee. Repeat 3 times. Complete this stretch 2 times per day.   STRENGTHENING EXERCISES - Achilles Tendinitis These exercises may help you when beginning to rehabilitate your injury. They may resolve your symptoms with or without further involvement from your physician, physical therapist or athletic trainer. While completing these exercises, remember:   Muscles can gain both the endurance and the strength needed for everyday activities through controlled exercises.  Complete these exercises as instructed by your physician, physical therapist or athletic trainer. Progress the resistance and repetitions only as guided.  You may experience muscle soreness or fatigue, but the pain or discomfort you are trying to eliminate should never worsen during these exercises. If this pain does worsen, stop and make certain you are following the directions exactly. If the pain is still present after adjustments, discontinue the exercise until you can discuss the trouble with your clinician.  STRENGTH - Plantar-flexors   Sit with your right / left leg extended. Holding onto both ends of a rubber exercise band/tubing, loop it around the ball of your foot. Keep a slight  tension in the band.  Slowly push your toes away from you, pointing them downward.  Hold this position for 10 seconds. Return slowly, controlling the tension in the band/tubing. Repeat 3 times. Complete this exercise 2 times per day.   STRENGTH - Plantar-flexors   Stand with your feet   shoulder width apart. Steady yourself with a wall or table using as little support as needed.  Keeping your weight evenly spread over the width of your feet, rise up on your toes.*  Hold this position for 10 seconds. Repeat 3 times. Complete this exercise 2 times per day.  *If this is too easy, shift your weight toward your right / left leg until you feel challenged. Ultimately, you may be asked to do this exercise with your right / left foot only.  STRENGTH  Plantar-flexors, Eccentric  Note: This exercise can place a lot of stress on your foot and ankle. Please complete this exercise only if specifically instructed by your caregiver.   Place the balls of your feet on a step. With your hands, use only enough support from a wall or rail to keep your balance.  Keep your knees straight and rise up on your toes.  Slowly shift your weight entirely to your right / left toes and pick up your opposite foot. Gently and with controlled movement, lower your weight through your right / left foot so that your heel drops below the level of the step. You will feel a slight stretch in the back of your calf at the end position.  Use the healthy leg to help rise up onto the balls of both feet, then lower weight only on the right / left leg again. Build up to 15 repetitions. Then progress to 3 consecutive sets of 15 repetitions.*  After completing the above exercise, complete the same exercise with a slight knee bend (about 30 degrees). Again, build up to 15 repetitions. Then progress to 3 consecutive sets of 15 repetitions.* Perform this exercise 2 times per day.  *When you easily complete 3 sets of 15, your physician,  physical therapist or athletic trainer may advise you to add resistance by wearing a backpack filled with additional weight.  STRENGTH - Plantar Flexors, Seated   Sit on a chair that allows your feet to rest flat on the ground. If necessary, sit at the edge of the chair.  Keeping your toes firmly on the ground, lift your right / left heel as far as you can without increasing any discomfort in your ankle. Repeat 3 times. Complete this exercise 2 times a day.  

## 2014-12-10 NOTE — Progress Notes (Signed)
   Subjective:    Patient ID: Julie Combs, female    DOB: Feb 14, 1966, 49 y.o.   MRN: [id number]  HPI Patient presents with bilateral foot pain. On Left foot-back of heel and arch, x6 month. On Right foot-medial side-in June twisted foot.   Review of Systems  Constitutional: Positive for fatigue.  HENT: Positive for sinus pressure and tinnitus.   Cardiovascular: Positive for palpitations.  Musculoskeletal: Positive for myalgias.  Hematological: Bruises/bleeds easily.  All other systems reviewed and are negative.      Objective:   Physical Exam        Assessment & Plan:

## 2014-12-13 NOTE — Progress Notes (Signed)
Subjective:     Patient ID: Julie Combs, female   DOB: 1965-10-13, 49 y.o.   MRN: [id number]  HPI patient presents stating I'm having a lot of pain in the bottom of my left heel and I'm also getting pain on the inside of my right ankle. States the heels been hurting for at least 6 months and the ankle for the last several months   Review of Systems  All other systems reviewed and are negative.      Objective:   Physical Exam  Constitutional: She is oriented to person, place, and time.  Cardiovascular: Intact distal pulses.   Musculoskeletal: Normal range of motion.  Neurological: She is oriented to person, place, and time.  Skin: Skin is warm.  Nursing note and vitals reviewed.  neurovascular status found to be intact with muscle strength adequate range of motion within normal limits. Patient does have good digital perfusion is well oriented with moderate depression of the arch bilateral. I noted there to be exquisite discomfort left plantar fascia at the insertional point tendon into the calcaneus and moderate discomfort in the posterior tibial tendon near its insertion into the navicular on the right foot. I did not note any muscle strength loss upon testing     Assessment:     Acute plantar fasciitis left with probable posterior tibial tendinitis right secondary to sprained ankle and probable gait change from heel pain    Plan:     H&P and x-rays of both feet discussed with patient. Today I injected the left plantar fascia 3 mg Kenalog 5 mg Xylocaine and along the sheath the posterior tibial tendon right 3 mg dexamethasone Kenalog 5 mg Xylocaine and applied fascial brace bilateral. Gave instructions on physical therapy and reappoint to recheck

## 2014-12-24 ENCOUNTER — Ambulatory Visit (INDEPENDENT_AMBULATORY_CARE_PROVIDER_SITE_OTHER): Payer: BC Managed Care – PPO | Admitting: Podiatry

## 2014-12-24 ENCOUNTER — Encounter: Payer: Self-pay | Admitting: Podiatry

## 2014-12-24 VITALS — BP 113/71 | HR 82 | Resp 16

## 2014-12-24 DIAGNOSIS — M779 Enthesopathy, unspecified: Secondary | ICD-10-CM | POA: Diagnosis not present

## 2014-12-24 DIAGNOSIS — M79672 Pain in left foot: Secondary | ICD-10-CM

## 2014-12-26 NOTE — Progress Notes (Signed)
Subjective:     Patient ID: Julie Combs, female   DOB: 27-Mar-1966, 49 y.o.   MRN: [id number]  HPI patient states I'm still having pain but improved from where it was previously but still difficult to do extended type of activity   Review of Systems     Objective:   Physical Exam Neurovascular status muscle strength adequate with continued discomfort in the plantar fascia bilateral at the insertional point of the tendon into the calcaneus    Assessment:     Plantar fasciitis bilateral still present    Plan:     Advised on physical therapy supportive shoe and scanned for a Berkley type orthotic to give complete support to the heel arch and forefoot. Reappoint to recheck

## 2015-01-14 ENCOUNTER — Ambulatory Visit (INDEPENDENT_AMBULATORY_CARE_PROVIDER_SITE_OTHER): Payer: BC Managed Care – PPO | Admitting: Podiatry

## 2015-01-14 ENCOUNTER — Encounter: Payer: Self-pay | Admitting: Podiatry

## 2015-01-14 VITALS — BP 130/87 | HR 90 | Resp 12

## 2015-01-14 DIAGNOSIS — M722 Plantar fascial fibromatosis: Secondary | ICD-10-CM | POA: Diagnosis not present

## 2015-01-14 DIAGNOSIS — M76821 Posterior tibial tendinitis, right leg: Secondary | ICD-10-CM | POA: Diagnosis not present

## 2015-01-14 NOTE — Patient Instructions (Signed)

## 2015-01-16 NOTE — Progress Notes (Signed)
Subjective:     Patient ID: Julie Combs, female   DOB: 1965/10/05, 49 y.o.   MRN: [id number]  HPI patient states my feet are not hurting any were near as bad and I'm excited to get my orthotics   Review of Systems     Objective:   Physical Exam Neurovascular status intact muscle strength adequate range of motion within normal limits with patient's feet continuing to improve from plantar fascial symptoms left and tendinitis right with minimal discomfort upon palpation    Assessment:     Improving plantar fasciitis left tendinitis right    Plan:     Reviewed conditions and at this time recommended physical therapy to be continued supportive shoes and dispensed orthotics with instructions on usage. Will be seen back if symptoms persist or get worse

## 2015-11-05 ENCOUNTER — Ambulatory Visit (INDEPENDENT_AMBULATORY_CARE_PROVIDER_SITE_OTHER): Payer: BC Managed Care – PPO

## 2015-11-05 ENCOUNTER — Ambulatory Visit (INDEPENDENT_AMBULATORY_CARE_PROVIDER_SITE_OTHER): Payer: BC Managed Care – PPO | Admitting: Osteopathic Medicine

## 2015-11-05 VITALS — BP 122/78 | HR 99 | Temp 99.0°F | Resp 12 | Ht 64.0 in | Wt 136.0 lb

## 2015-11-05 DIAGNOSIS — R05 Cough: Secondary | ICD-10-CM

## 2015-11-05 DIAGNOSIS — B9789 Other viral agents as the cause of diseases classified elsewhere: Secondary | ICD-10-CM

## 2015-11-05 DIAGNOSIS — R059 Cough, unspecified: Secondary | ICD-10-CM

## 2015-11-05 DIAGNOSIS — B349 Viral infection, unspecified: Secondary | ICD-10-CM | POA: Diagnosis not present

## 2015-11-05 DIAGNOSIS — M329 Systemic lupus erythematosus, unspecified: Secondary | ICD-10-CM

## 2015-11-05 DIAGNOSIS — J988 Other specified respiratory disorders: Secondary | ICD-10-CM

## 2015-11-05 MED ORDER — NAPROXEN 500 MG PO TABS: 250.0000 mg | ORAL_TABLET | Freq: Two times a day (BID) | ORAL | 0 refills | Status: DC

## 2015-11-05 MED ORDER — BENZONATATE 200 MG PO CAPS: 200.0000 mg | ORAL_CAPSULE | Freq: Three times a day (TID) | ORAL | 0 refills | Status: DC | PRN

## 2015-11-05 MED ORDER — GUAIFENESIN-CODEINE 100-10 MG/5ML PO SYRP: 5.0000 mL | ORAL_SOLUTION | Freq: Three times a day (TID) | ORAL | 0 refills | Status: DC | PRN

## 2015-11-05 MED ORDER — FLUTICASONE-SALMETEROL 115-21 MCG/ACT IN AERO: 2.0000 | INHALATION_SPRAY | Freq: Two times a day (BID) | RESPIRATORY_TRACT | 0 refills | Status: DC

## 2015-11-05 NOTE — Patient Instructions (Addendum)
  Most likely your symptoms can be explained by post viral cough syndrome or a viral bronchitis or other respiratory infection, which typically clears up within 2-3 weeks. However, given your history of lupus, need to make sure that this cough is resolving as expected and that there are no concerns for pulmonary complications associated with lupus.   We can treat symptomatically with the guaifenesin-codeine/Robitussin-AC cough syrup and benzonatate/Tessalon oral medication, can add on steroid inhaler if no improvement on the cough medicines as noted above after a few days. Can take naproxen for pain. If your pain symptoms worsen, or if shortness of breath worsens, please return for repeat chest x-ray, and at that point we may need to consider steroid therapy or other further evaluation based on your symptoms and on your physical exam.   If severe shortness of breath, particularly if any dizziness, chest pain, fever/chills, or other concerns - would recommend evaluation in the emergency room ASAP.   IF you received an x-ray today, you will receive an invoice from [facility]. Please contact [facility]iology at [phone] with questions or concerns regarding your invoice.   IF you received labwork today, you will receive an invoice from [employer]. Please contact [facility] at [phone] with questions or concerns regarding your invoice.   Our billing staff will not be able to assist you with questions regarding bills from these companies.  You will be contacted with the lab results as soon as they are available. The fastest way to get your results is to activate your My Chart account. Instructions are located on the last page of this paperwork. If you have not heard from [place] regarding the results in 2 weeks, please contact this office.

## 2015-11-05 NOTE — Progress Notes (Signed)
HPI: Julie Combs is a 50 y.o. female who presents to [facility]  Today 11/05/15 for chief complaint of:  Chief Complaint  Patient presents with  . Shortness of Breath    1 day  . Cough  . Headache    COUGH/SOB, Pleuritic Pain . Context:  Was also sick last weekend with fever and sore throat, better by wednesday . Quality/Location: chest congestion/tightness . Assoc signs/symptoms: see ROS . Duration: 1 days . Modifying factors: has tried the following OTC/Rx medications: none   Past medical, social and family history reviewed. Current medications and allergies reviewed.     Review of Systems: CONSTITUTIONAL: no fever/chills HEAD/EYES/EARS/NOSE/THROAT: no headache, no vision change or hearing change, no sore throat CARDIAC: No chest pain, No pressure/palpitations RESPIRATORY: yes cough, no shortness of breath GASTROINTESTINAL: no nausea, no vomiting, no abdominal pain, no diarrhea MUSCULOSKELETAL: no myalgia/arthralgia SKIN: no rash  Exam:  BP 122/78   Pulse 99   Temp 99 F (37.2 C) (Oral)   Resp 12   Ht 5\' 4"  (1.626 m)   Wt 136 lb (61.7 kg)   SpO2 100%   BMI 23.34 kg/m  Constitutional: VSS, see above. General Appearance: alert, well-developed, well-nourished, NAD Eyes: Normal lids and conjunctive, non-icteric sclera, PERRLA Ears, Nose, Mouth, Throat: Normal external inspection ears/nares/mouth/lips/gums, normal TM, MMM;       posterior pharynx without erythema, without exudate, nasal mucosa normal Neck: No masses, trachea m idline. normal lymph nodes Respiratory: Normal respiratory effort. No  wheeze/rhonchi/rales. No crackles on exam, no hypoxemia concerning for Lupus Pneumonitis. No audible rub concerning for pleural disease.  Cardiovascular: S1/S2 normal, no murmur/rub/gallop auscultated. RRR.    Dg Chest 2 View  Result Date: 11/05/2015 CLINICAL DATA:  Cough. EXAM: CHEST  2 VIEW COMPARISON:  February 18, 2009 FINDINGS: The heart  size and mediastinal contours are within normal limits. Both lungs are clear. The visualized skeletal structures are unremarkable. IMPRESSION: No active cardiopulmonary disease. Electronically Signed   By: Dorise Bullion III M.D   On: 11/05/2015 14:39   CXR personally reviewed - no other concerns    ASSESSMENT/PLAN:  Cough - Plan: DG Chest 2 View, benzonatate (TESSALON) 200 MG capsule, guaiFENesin-codeine (ROBITUSSIN AC) 100-10 MG/5ML syrup, DISCONTINUED: guaiFENesin-codeine (ROBITUSSIN AC) 100-10 MG/5ML syrup  SLE (systemic lupus erythematosus) ([place])  Viral respiratory illness    Visit summary was printed for the patient with medications and pertinent instructions for patient to review. ER/RTC precautions reviewed. All questions answered. Return if symptoms worsen or fail to improve, and as directed by rheumatology/PCP for routine care.

## 2017-02-01 ENCOUNTER — Encounter: Payer: Self-pay | Admitting: Internal Medicine

## 2017-04-09 ENCOUNTER — Ambulatory Visit (AMBULATORY_SURGERY_CENTER): Payer: Self-pay

## 2017-04-09 VITALS — Ht 64.0 in | Wt 131.6 lb

## 2017-04-09 DIAGNOSIS — Z1211 Encounter for screening for malignant neoplasm of colon: Secondary | ICD-10-CM

## 2017-04-09 MED ORDER — NA SULFATE-K SULFATE-MG SULF 17.5-3.13-1.6 GM/177ML PO SOLN: 1.0000 | Freq: Once | ORAL | 0 refills | Status: AC

## 2017-04-09 NOTE — Progress Notes (Signed)
Per pt, no allergies to soy or egg products.Pt not taking any weight loss meds or using  O2 at home.  Pt refused emmi video. 

## 2017-04-10 ENCOUNTER — Encounter: Payer: Self-pay | Admitting: Internal Medicine

## 2017-04-23 ENCOUNTER — Other Ambulatory Visit: Payer: Self-pay

## 2017-04-23 ENCOUNTER — Ambulatory Visit (AMBULATORY_SURGERY_CENTER): Payer: BC Managed Care – PPO | Admitting: Internal Medicine

## 2017-04-23 ENCOUNTER — Encounter: Payer: Self-pay | Admitting: Internal Medicine

## 2017-04-23 VITALS — BP 115/73 | HR 61 | Temp 98.0°F | Resp 12 | Ht 64.0 in | Wt 131.0 lb

## 2017-04-23 DIAGNOSIS — D123 Benign neoplasm of transverse colon: Secondary | ICD-10-CM

## 2017-04-23 DIAGNOSIS — Z1212 Encounter for screening for malignant neoplasm of rectum: Secondary | ICD-10-CM | POA: Diagnosis not present

## 2017-04-23 DIAGNOSIS — Z1211 Encounter for screening for malignant neoplasm of colon: Secondary | ICD-10-CM

## 2017-04-23 DIAGNOSIS — K635 Polyp of colon: Secondary | ICD-10-CM

## 2017-04-23 MED ORDER — SODIUM CHLORIDE 0.9 % IV SOLN: 500.0000 mL | Freq: Once | INTRAVENOUS | Status: AC

## 2017-04-23 NOTE — Op Note (Signed)
[place] Patient Name: Julie Combs Procedure Date: 04/23/2017 9:07 AM MRN: [id number] Endoscopist: Jerene Bears , MD Age: 52 Referring MD:  Date of Birth: [birth year] Gender: Female Account #: [id number] Procedure:                Colonoscopy Indications:              Screening for colorectal malignant neoplasm, This                            is the patient's first colonoscopy Medicines:                Monitored Anesthesia Care Procedure:                Pre-Anesthesia Assessment:                           - Prior to the procedure, a History and Physical                            was performed, and patient medications and                            allergies were reviewed. The patient's tolerance of                            previous anesthesia was also reviewed. The risks                            and benefits of the procedure and the sedation                            options and risks were discussed with the patient.                            All questions were answered, and informed consent                            was obtained. Prior Anticoagulants: The patient has                            taken no previous anticoagulant or antiplatelet                            agents. ASA Grade Assessment: II - A patient with                            mild systemic disease. After reviewing the risks                            and benefits, the patient was deemed in                            satisfactory condition to undergo the procedure.  After obtaining informed consent, the colonoscope                            was passed under direct vision. Throughout the                            procedure, the patient's blood pressure, pulse, and                            oxygen saturations were monitored continuously. The                            Colonoscope was introduced through the anus and                            advanced to the the  cecum, identified by                            appendiceal orifice and ileocecal valve. The                            colonoscopy was performed without difficulty. The                            patient tolerated the procedure well. The quality                            of the bowel preparation was good. The ileocecal                            valve, appendiceal orifice, and rectum were                            photographed. Scope In: 9:15:29 AM Scope Out: 9:28:18 AM Scope Withdrawal Time: 0 hours 10 minutes 4 seconds  Total Procedure Duration: 0 hours 12 minutes 49 seconds  Findings:                 The digital rectal exam was normal.                           A 3 mm polyp was found in the transverse colon. The                            polyp was sessile. The polyp was removed with a                            cold snare. Resection and retrieval were complete.                           The exam was otherwise without abnormality on                            direct and retroflexion views. Complications:  No immediate complications. Estimated Blood Loss:     Estimated blood loss was minimal. Impression:               - One 3 mm polyp in the transverse colon, removed                            with a cold snare. Resected and retrieved.                           - The examination was otherwise normal on direct                            and retroflexion views. Recommendation:           - Patient has a contact number available for                            emergencies. The signs and symptoms of potential                            delayed complications were discussed with the                            patient. Return to normal activities tomorrow.                            Written discharge instructions were provided to the                            patient.                           - Resume previous diet.                           - Continue present medications.                            - Await pathology results.                           - Repeat colonoscopy is recommended. The                            colonoscopy date will be determined after pathology                            results from today's exam become available for                            review. Jerene Bears, MD 04/23/2017 9:30:59 AM This report has been signed electronically.

## 2017-04-23 NOTE — Progress Notes (Signed)
Pt's states no medical or surgical changes since previsit or office visit. 

## 2017-04-23 NOTE — Progress Notes (Signed)
Report given to PACU, vss 

## 2017-04-23 NOTE — Progress Notes (Signed)
Called to room to assist during endoscopic procedure.  Patient ID and intended procedure confirmed with present staff. Received instructions for my participation in the procedure from the performing physician.  

## 2017-04-23 NOTE — Patient Instructions (Signed)
YOU HAD AN ENDOSCOPIC PROCEDURE TODAY AT THE [facility]:   Refer to the procedure report that was given to you for any specific questions about what was found during the examination.  If the procedure report does not answer your questions, please call your gastroenterologist to clarify.  If you requested that your care partner not be given the details of your procedure findings, then the procedure report has been included in a sealed envelope for you to review at your convenience later.  YOU SHOULD EXPECT: Some feelings of bloating in the abdomen. Passage of more gas than usual.  Walking can help get rid of the air that was put into your GI tract during the procedure and reduce the bloating. If you had a lower endoscopy (such as a colonoscopy or flexible sigmoidoscopy) you may notice spotting of blood in your stool or on the toilet paper. If you underwent a bowel prep for your procedure, you may not have a normal bowel movement for a few days.  Please Note:  You might notice some irritation and congestion in your nose or some drainage.  This is from the oxygen used during your procedure.  There is no need for concern and it should clear up in a day or so.  SYMPTOMS TO REPORT IMMEDIATELY:   Following lower endoscopy (colonoscopy or flexible sigmoidoscopy):  Excessive amounts of blood in the stool  Significant tenderness or worsening of abdominal pains  Swelling of the abdomen that is new, acute  Fever of 100F or higher   For urgent or emergent issues, a gastroenterologist can be reached at any hour by calling [phone].   DIET:  We do recommend a small meal at first, but then you may proceed to your regular diet.  Drink plenty of fluids but you should avoid alcoholic beverages for 24 hours.  ACTIVITY:  You should plan to take it easy for the rest of today and you should NOT DRIVE or use heavy machinery until tomorrow (because of the sedation medicines used during the test).     FOLLOW UP: Our staff will call the number listed on your records the next business day following your procedure to check on you and address any questions or concerns that you may have regarding the information given to you following your procedure. If we do not reach you, we will leave a message.  However, if you are feeling well and you are not experiencing any problems, there is no need to return our call.  We will assume that you have returned to your regular daily activities without incident.  If any biopsies were taken you will be contacted by phone or by letter within the next 1-3 weeks.  Please call us at [phone] if you have not heard about the biopsies in 3 weeks.    SIGNATURES/CONFIDENTIALITY: You and/or your care partner have signed paperwork which will be entered into your electronic medical record.  These signatures attest to the fact that that the information above on your After Visit Summary has been reviewed and is understood.  Full responsibility of the confidentiality of this discharge information lies with you and/or your care-partner.  Polyp information given. 

## 2017-04-24 ENCOUNTER — Telehealth: Payer: Self-pay

## 2017-04-24 NOTE — Telephone Encounter (Signed)
  Follow up Call-  Call back number 04/23/2017  Post procedure Call Back phone  # [phone]  Permission to leave phone message Yes  Some recent data might be hidden     Patient questions:  Do you have a fever, pain , or abdominal swelling? No. Pain Score  0 *  Have you tolerated food without any problems? Yes.    Have you been able to return to your normal activities? Yes.    Do you have any questions about your discharge instructions: Diet   No. Medications  No. Follow up visit  No.  Do you have questions or concerns about your Care? No.  Actions: * If pain score is 4 or above: No action needed, pain <4.

## 2017-04-26 ENCOUNTER — Encounter: Payer: Self-pay | Admitting: Internal Medicine

## 2017-06-12 ENCOUNTER — Ambulatory Visit: Payer: Self-pay | Admitting: Emergency Medicine

## 2017-06-12 ENCOUNTER — Encounter: Payer: Self-pay | Admitting: Emergency Medicine

## 2017-06-12 VITALS — BP 130/80 | HR 113 | Temp 100.3°F | Wt 131.2 lb

## 2017-06-12 DIAGNOSIS — R6889 Other general symptoms and signs: Secondary | ICD-10-CM

## 2017-06-12 LAB — POCT INFLUENZA A/B
INFLUENZA A, POC: NEGATIVE
Influenza B, POC: NEGATIVE

## 2017-06-12 NOTE — Patient Instructions (Signed)

## 2017-06-12 NOTE — Progress Notes (Signed)
S: Julie Combs is a 52 y.o. female who presents for evaluation of headahce, fever, and neck stiffness. Headache is a dull ache, no light sensitivity, described as throbbing, not worsened with movement. Symptoms have been on going for two days. Not relieved with OTC therapies. No hx of recent travel, past hx of lupus.  Review of Systems  Constitutional: Positive for chills, fever and malaise/fatigue.  HENT: Positive for congestion. Negative for sinus pain and sore throat.   Respiratory: Negative for cough.   Cardiovascular: Negative for chest pain and palpitations.  Gastrointestinal: Negative for diarrhea, nausea and vomiting.  Musculoskeletal: Positive for myalgias and neck pain.  Neurological: Positive for headaches. Negative for dizziness.   O: Vitals:   06/12/17 1940  BP: 130/80  Pulse: (!) 113  Temp: 100.3 F (37.9 C)  SpO2: 98%   Physical Exam  Constitutional: She appears well-developed and well-nourished. No distress.  HENT:  Head: Normocephalic and atraumatic.  Right Ear: External ear normal.  Left Ear: External ear normal.  Mouth/Throat: Oropharynx is clear and moist.  Eyes: Conjunctivae are normal.  Neck: Normal range of motion. Neck supple. No neck rigidity. Normal range of motion present. No Brudzinski's sign and no Kernig's sign noted.  Cardiovascular: Regular rhythm. Tachycardia present.  Pulmonary/Chest: Effort normal and breath sounds normal.  Abdominal: Soft.  Lymphadenopathy:    She has no cervical adenopathy.  Skin: Skin is warm. Capillary refill takes less than 2 seconds. She is not diaphoretic.  Nursing note and vitals reviewed.   A: 1. Flu-like symptoms     P: Flu negative, no meningeal signs or concerning history with headache. Conservative treatments with OTC therapies, counseling to go to the ER for worsening symptoms.

## 2017-06-18 ENCOUNTER — Ambulatory Visit: Payer: BC Managed Care – PPO | Admitting: Physician Assistant

## 2017-06-18 ENCOUNTER — Encounter: Payer: Self-pay | Admitting: Physician Assistant

## 2017-06-18 ENCOUNTER — Other Ambulatory Visit: Payer: Self-pay

## 2017-06-18 VITALS — BP 128/82 | HR 82 | Temp 98.7°F | Resp 18 | Ht 64.0 in | Wt 129.0 lb

## 2017-06-18 DIAGNOSIS — H6692 Otitis media, unspecified, left ear: Secondary | ICD-10-CM

## 2017-06-18 DIAGNOSIS — H9203 Otalgia, bilateral: Secondary | ICD-10-CM

## 2017-06-18 MED ORDER — AMOXICILLIN-POT CLAVULANATE 875-125 MG PO TABS: 1.0000 | ORAL_TABLET | Freq: Two times a day (BID) | ORAL | 0 refills | Status: DC

## 2017-06-18 NOTE — Progress Notes (Signed)
Julie Combs  MRN: [id number] DOB: 1965-12-12  PCP: Deland Pretty, MD  Subjective:  Pt is a 52 year old female who presents to clinic for ear pain. L>R. "feels full" started yesterday. "felt like someone was stabbing me in the ear".  She was seen at primary care office 6 days ago c/o HA, fever, and chills x 2 days. Negative flu test. Dx with viral upper resperatory infection and treated conservatively.  Her symptoms were gradually improving over the past week, however significantly worsened last night with "stabbing "ear pain.  She has not taken anything to feel better.  She took Robitussin last night for her cough, this helped her sleep  Review of Systems  Constitutional: Negative for chills, diaphoresis, fatigue and fever.  HENT: Positive for ear pain. Negative for congestion, ear discharge, postnasal drip, rhinorrhea, sinus pressure, sinus pain and sore throat.   Respiratory: Negative for cough, shortness of breath and wheezing.   Psychiatric/Behavioral: Negative for sleep disturbance.    Patient Active Problem List   Diagnosis Date Noted  . Palpitations 06/18/2014  . Nonspecific (abnormal) findings on radiological and other examination of body structure 03/14/2009  . ABNORMAL CHEST XRAY 03/14/2009  . LUPUS 03/11/2009  . HEADACHE, CHRONIC 03/11/2009    Current Outpatient Medications on File Prior to Visit  Medication Sig Dispense Refill  . citalopram (CELEXA) 20 MG tablet Take 20 mg by mouth daily.    Marland Kitchen ibuprofen (ADVIL,MOTRIN) 400 MG tablet Take 400 mg by mouth as needed.    Marland Kitchen VITAMIN D, CHOLECALCIFEROL, PO Take 5,000 Units by mouth daily.     . Doxylamine Succinate, Sleep, (SLEEP AID PO) Take by mouth as needed.    . leflunomide (ARAVA) 20 MG tablet Take 20 mg by mouth daily.    . naproxen (NAPROSYN) 500 MG tablet Take 0.5-1 tablets (250-500 mg total) by mouth 2 (two) times daily with a meal. As needed for pain (Patient not taking: Reported on 04/09/2017) 40 tablet 0    Current Facility-Administered Medications on File Prior to Visit  Medication Dose Route Frequency Provider Last Rate Last Dose  . 0.9 %  sodium chloride infusion  500 mL Intravenous Once Pyrtle, Lajuan Lines, MD        Allergies  Allergen Reactions  . Tetanus Toxoid     Swelling, high fever     Objective:  BP 128/82 (BP Location: Left Arm, Patient Position: Sitting, Cuff Size: Normal)   Pulse 82   Temp 98.7 F (37.1 C) (Oral)   Resp 18   Ht 5\' 4"  (1.626 m)   Wt 129 lb (58.5 kg)   SpO2 99%   BMI 22.14 kg/m   Physical Exam  Constitutional: She is oriented to person, place, and time and well-developed, well-nourished, and in no distress. No distress.  HENT:  Right Ear: Tympanic membrane normal. Tympanic membrane is not erythematous, not retracted and not bulging.  Left Ear: No tenderness. No mastoid tenderness. Tympanic membrane is injected and bulging. Tympanic membrane is not perforated. A middle ear effusion is present.  Nose: Mucosal edema present. No rhinorrhea. Right sinus exhibits no maxillary sinus tenderness and no frontal sinus tenderness. Left sinus exhibits no maxillary sinus tenderness and no frontal sinus tenderness.  Mouth/Throat: Oropharynx is clear and moist and mucous membranes are normal.  Cardiovascular: Normal rate, regular rhythm and normal heart sounds.  Pulmonary/Chest: Effort normal and breath sounds normal. No respiratory distress. She has no wheezes. She has no rales.  Neurological: She is  alert and oriented to person, place, and time. GCS score is 15.  Skin: Skin is warm and dry.  Psychiatric: Mood, memory, affect and judgment normal.  Vitals reviewed.   Assessment and Plan :  1. Left otitis media, unspecified otitis media type 2. Otalgia of both ears - amoxicillin-clavulanate (AUGMENTIN) 875-125 MG tablet; Take 1 tablet by mouth 2 (two) times daily.  Dispense: 14 tablet; Refill: 0 -Patient endorses recent viral illness with worsening symptoms last  night.  Physical exam suggests otitis media plan to treat. RTC if no improvement in 5-7 days.   Mercer Pod, PA-C  [facility] 06/18/2017 12:18 PM

## 2017-06-18 NOTE — Patient Instructions (Addendum)
Take augmentin twice a day for the next 7 days. Finish the ENTIRE COURSE of antibiotics, even if you feel better sooner.  Stay well hydrated - drink 1-2 liters of water daily.  Avoid dairy for the next 5-7 days - dairy can increase your mucus production.  An antihistamine may help reduce inflammation - try claritin-D.  Come back if you are not improving in 7-10 days.   Thank you for coming in today. I hope you feel we met your needs.  Feel free to call PCP if you have any questions or further requests.  Please consider signing up for MyChart if you do not already have it, as this is a great way to communicate with me.  Best,  Whitney McVey, PA-C   We recommend that you schedule a mammogram for breast cancer screening. Typically, you do not need a referral to do this. Please contact a local imaging center to schedule your mammogram.  [facility] - [phone]  *ask for the Radiology Department [facility] ([place]) - [phone] or [phone]  [facility] - [phone] [place] [phone] [facility] - [phone]  *ask for the [facility] - [phone]  *ask for the Radiology Department [facility] - [phone]  *ask for the [facility] - [phone]    IF you received an x-ray today, you will receive an invoice from [facility]iology. Please contact [facility] Radiology at [phone] with questions or concerns regarding your invoice.   IF you received labwork today, you will receive an invoice from [place] [facility]. Please contact [facility] at [phone] with questions or concerns regarding your invoice.   Our billing staff will not be able to assist you with questions regarding bills from these companies.  You will be contacted with the lab results as soon as they are available. The fastest way to get your results is to  activate your My Chart account. Instructions are located on the last page of this paperwork. If you have not heard from [place] regarding the results in 2 weeks, please contact this office.

## 2018-05-16 ENCOUNTER — Ambulatory Visit (HOSPITAL_COMMUNITY)
Admission: EM | Admit: 2018-05-16 | Discharge: 2018-05-16 | Disposition: A | Discharge status: Home / Self Care | Payer: BC Managed Care – PPO | Attending: Internal Medicine | Admitting: Internal Medicine

## 2018-05-16 ENCOUNTER — Encounter (HOSPITAL_COMMUNITY): Payer: Self-pay

## 2018-05-16 DIAGNOSIS — R0789 Other chest pain: Secondary | ICD-10-CM | POA: Diagnosis not present

## 2018-05-16 NOTE — ED Triage Notes (Signed)
Pt presents with rib and flank pain on the right side not related to any injury.  Pt has been treated for bronchitis

## 2018-05-16 NOTE — ED Provider Notes (Signed)
[place]    CSN: [id number] Arrival date & time: 05/16/18  [place]     History   Chief Complaint Chief Complaint  Patient presents with  . Flank Pain    HPI Julie Combs is a 53 y.o. female with a history of SLE, bronchitis currently on Augmentin comes to the urgent care on account of right-sided rib pain.  Patient denies any injury or fall.  Pain is of moderate severity.  Aggravated by movement, palpation and cough.  Associated with sitting still.  Patient denies any fever, chills, calf swelling.   HPI  Past Medical History:  Diagnosis Date  . Anxiety   . Arthritis   . Lupus ([place])   . Palpitations    unifocal PVCs on monitor    Patient Active Problem List   Diagnosis Date Noted  . Palpitations 06/18/2014  . Nonspecific (abnormal) findings on radiological and other examination of body structure 03/14/2009  . ABNORMAL CHEST XRAY 03/14/2009  . LUPUS 03/11/2009  . HEADACHE, CHRONIC 03/11/2009    Past Surgical History:  Procedure Laterality Date  . CPX  03/17/12   FUNCTIONAL STATUS:REDUCED9PEAK VO2=75% OF PREDICTED.CV STATUS:IMPAIRED.SUBOPTIMAL PEAK CV STRESS LOAD.LOW AT IS SUGGESTIVE OF IMPAIRED CARDIAC FUNCTION.PULMONARY STATUS:NOT IMPAIRED.LIMITING FACTOR=CARDIAC + EFFORT.  Marland Kitchen laproscopic surg  2003   for endometriosis  . NASAL SINUS SURGERY  1994  . TRANSTHORACIC ECHOCARDIOGRAM  02/26/12   EF 55%-60%.NORMAL STUDY.    OB History   No obstetric history on file.      Home Medications    Prior to Admission medications   Medication Sig Start Date End Date Taking? Authorizing Provider  amoxicillin-clavulanate (AUGMENTIN) 875-125 MG tablet Take 1 tablet by mouth 2 (two) times daily. 06/18/17   McVey, Gelene Mink, PA-C  citalopram (CELEXA) 20 MG tablet Take 20 mg by mouth daily.    [provider]  Doxylamine Succinate, Sleep, (SLEEP AID PO) Take by mouth as needed.    [provider]  ibuprofen (ADVIL,MOTRIN) 400 MG  tablet Take 400 mg by mouth as needed.    [provider]  leflunomide (ARAVA) 20 MG tablet Take 20 mg by mouth daily.    [provider]  VITAMIN D, CHOLECALCIFEROL, PO Take 5,000 Units by mouth daily.     [provider]    Family History Family History  Problem Relation Age of Onset  . Thyroid cancer Sister     Social History Social History   Tobacco Use  . Smoking status: Never Smoker  . Smokeless tobacco: Never Used  Substance Use Topics  . Alcohol use: Yes    Alcohol/week: 3.0 standard drinks    Types: 3 Standard drinks or equivalent per week    Comment: socially  . Drug use: No     Allergies   Tetanus toxoid   Review of Systems Review of Systems  Constitutional: Negative for activity change, appetite change, fatigue and fever.  Respiratory: Positive for cough. Negative for chest tightness.   Cardiovascular: Positive for chest pain. Negative for palpitations.  Gastrointestinal: Negative for abdominal distention and abdominal pain.  Musculoskeletal: Negative for arthralgias, joint swelling and myalgias.  Neurological: Negative for dizziness, weakness, numbness and headaches.  Hematological: Negative for adenopathy.     Physical Exam Triage Vital Signs ED Triage Vitals [05/16/18 1644]  Enc Vitals Group     BP (!) 156/88     Pulse Rate 80     Resp 15     Temp 98.2 F (36.8 C)  Temp Source Oral     SpO2 98 %     Weight      Height      Head Circumference      Peak Flow      Pain Score 8     Pain Loc      Pain Edu?      Excl. in [place]?    No data found.  Updated Vital Signs BP (!) 156/88 (BP Location: Right Arm)   Pulse 80   Temp 98.2 F (36.8 C) (Oral)   Resp 15   SpO2 98%   Visual Acuity Right Eye Distance:   Left Eye Distance:   Bilateral Distance:    Right Eye Near:   Left Eye Near:    Bilateral Near:     Physical Exam Constitutional:      Appearance: Normal appearance. She is not ill-appearing or  toxic-appearing.  HENT:     Right Ear: Tympanic membrane normal.     Left Ear: Tympanic membrane normal.  Cardiovascular:     Rate and Rhythm: Normal rate and regular rhythm.  Pulmonary:     Effort: Pulmonary effort is normal.     Breath sounds: Normal breath sounds.  Abdominal:     General: Abdomen is flat.     Palpations: Abdomen is soft.  Musculoskeletal: Normal range of motion.     Comments: Tenderness on palpation over the right lower chest.  Point tenderness on the anterior part of the eight/ninth rib.  Neurological:     Mental Status: She is alert.      UC Treatments / Results  Labs (all labs ordered are listed, but only abnormal results are displayed) Labs Reviewed - No data to display  EKG None  Radiology No results found.  Procedures Procedures (including critical care time)  Medications Ordered in UC Medications - No data to display  Initial Impression / Assessment and Plan / UC Course  I have reviewed the triage vital signs and the nursing notes.  Pertinent labs & imaging results that were available during my care of the patient were reviewed by me and considered in my medical decision making (see chart for details).     1.  Musculoskeletal pain over the right lower rib: Deep breathing exercises to prevent atelectasis Over-the-counter pain medications Warm compress as needed  Final Clinical Impressions(s) / UC Diagnoses   Final diagnoses:  Musculoskeletal chest pain   Discharge Instructions   None    ED Prescriptions    None     Controlled Substance Prescriptions [place] Controlled Substance Registry consulted? Not Applicable   Chase Picket, MD 05/19/18 [phone]

## 2019-05-28 ENCOUNTER — Ambulatory Visit: Payer: BC Managed Care – PPO | Attending: Internal Medicine

## 2019-05-28 DIAGNOSIS — Z23 Encounter for immunization: Secondary | ICD-10-CM | POA: Insufficient documentation

## 2019-05-28 NOTE — Progress Notes (Signed)
   Covid-19 Vaccination Clinic  Name:  Julie Combs    MRN: [id number] DOB: 1966/01/16  05/28/2019  Ms. Bertin was observed post Covid-19 immunization for 15 minutes without incidence. She was provided with Vaccine Information Sheet and instruction to access the V-Safe system.   Ms. Besic was instructed to call 911 with any severe reactions post vaccine: Marland Kitchen Difficulty breathing  . Swelling of your face and throat  . A fast heartbeat  . A bad rash all over your body  . Dizziness and weakness    Immunizations Administered    Name Date Dose VIS Date Route   Pfizer COVID-19 Vaccine 05/28/2019 11:43 AM 0.3 mL 03/13/2019 Intramuscular   Manufacturer: [employer]   Lot: [id number]   [place]: [id number]

## 2019-06-08 ENCOUNTER — Ambulatory Visit (HOSPITAL_COMMUNITY)
Admission: EM | Admit: 2019-06-08 | Discharge: 2019-06-08 | Disposition: A | Discharge status: Home / Self Care | Payer: BC Managed Care – PPO

## 2019-06-08 ENCOUNTER — Encounter (HOSPITAL_COMMUNITY): Payer: Self-pay | Admitting: *Deleted

## 2019-06-08 ENCOUNTER — Other Ambulatory Visit: Payer: Self-pay

## 2019-06-08 DIAGNOSIS — M5412 Radiculopathy, cervical region: Secondary | ICD-10-CM

## 2019-06-08 DIAGNOSIS — M542 Cervicalgia: Secondary | ICD-10-CM

## 2019-06-08 MED ORDER — PREDNISONE 10 MG (21) PO TBPK: ORAL_TABLET | Freq: Every day | ORAL | 0 refills | Status: DC

## 2019-06-08 MED ORDER — CYCLOBENZAPRINE HCL 10 MG PO TABS: 10.0000 mg | ORAL_TABLET | Freq: Every day | ORAL | 0 refills | Status: DC

## 2019-06-08 MED ORDER — OMEPRAZOLE 20 MG PO CPDR: DELAYED_RELEASE_CAPSULE | ORAL | 0 refills | Status: DC

## 2019-06-08 NOTE — ED Triage Notes (Signed)
Patient reports she had sudden onset of neck pain, right side dominantly, in January. The stiffness has been intermittent since then but for the last week patient has had constant stiffness causing headache that radiates into right shoulder.

## 2019-06-08 NOTE — ED Provider Notes (Signed)
[place]    CSN: [id number] Arrival date & time: 06/08/19  1052      History   Chief Complaint Chief Complaint  Patient presents with  . Neck Pain    HPI Julie Combs is a 54 y.o. female.   Patient with past medical history of lupus presents urgent care for evaluation of right-sided neck pain for over 2 months.  She reports the pain starts in her mid neck and shoots up into her head and down her neck into her shoulder.  She reports left-sided neck pain as well however her right side is significantly worse.  Pain is both sharp and achy.  She is on naproxen twice a day for her lupus and this does not seem to have helped much.  She does endorse occasional numbness and tingling in her right arm.  Denies weakness in either extremity.  Turning her head to the right makes the pain worse and moving her shoulder at times makes the pain worse.  She denies a history of trauma to her neck or other injury.  She does believe at some point she had a neck x-ray done by her rheumatologist which she thought she may have a bone spur however we do not have record of this.     Past Medical History:  Diagnosis Date  . Anxiety   . Arthritis   . Lupus ([place])   . Palpitations    unifocal PVCs on monitor    Patient Active Problem List   Diagnosis Date Noted  . Palpitations 06/18/2014  . Nonspecific (abnormal) findings on radiological and other examination of body structure 03/14/2009  . ABNORMAL CHEST XRAY 03/14/2009  . LUPUS 03/11/2009  . HEADACHE, CHRONIC 03/11/2009    Past Surgical History:  Procedure Laterality Date  . CPX  03/17/12   FUNCTIONAL STATUS:REDUCED9PEAK VO2=75% OF PREDICTED.CV STATUS:IMPAIRED.SUBOPTIMAL PEAK CV STRESS LOAD.LOW AT IS SUGGESTIVE OF IMPAIRED CARDIAC FUNCTION.PULMONARY STATUS:NOT IMPAIRED.LIMITING FACTOR=CARDIAC + EFFORT.  Marland Kitchen laproscopic surg  2003   for endometriosis  . NASAL SINUS SURGERY  1994  . TRANSTHORACIC ECHOCARDIOGRAM  02/26/12   EF  55%-60%.NORMAL STUDY.    OB History   No obstetric history on file.      Home Medications    Prior to Admission medications   Medication Sig Start Date End Date Taking? Authorizing Provider  acetaminophen (TYLENOL) 650 MG CR tablet Take 650 mg by mouth every 8 (eight) hours as needed for pain.   Yes [provider]  citalopram (CELEXA) 20 MG tablet Take 20 mg by mouth daily.   Yes [provider]  naproxen (NAPROSYN) 500 MG tablet Take 500 mg by mouth 2 (two) times daily with a meal.   Yes [provider]  VITAMIN D, CHOLECALCIFEROL, PO Take 5,000 Units by mouth daily.    Yes [provider]  amoxicillin-clavulanate (AUGMENTIN) 875-125 MG tablet Take 1 tablet by mouth 2 (two) times daily. 06/18/17   McVey, Gelene Mink, PA-C  cyclobenzaprine (FLEXERIL) 10 MG tablet Take 1 tablet (10 mg total) by mouth at bedtime. 06/08/19   Ever Halberg, Marguerita Beards, PA-C  Doxylamine Succinate, Sleep, (SLEEP AID PO) Take by mouth as needed.    [provider]  ibuprofen (ADVIL,MOTRIN) 400 MG tablet Take 400 mg by mouth as needed.    [provider]  leflunomide (ARAVA) 20 MG tablet Take 20 mg by mouth daily.    [provider]  omeprazole (PRILOSEC) 20 MG capsule Take for duration of steroid therapy  06/08/19   [place] Fluegel, Marguerita Beards, PA-C  predniSONE (STERAPRED UNI-PAK 21 TAB) 10 MG (21) TBPK tablet Take by mouth daily. Take 6 tabs by mouth daily  for 2 days, then 5 tabs for 2 days, then 4 tabs for 2 days, then 3 tabs for 2 days, 2 tabs for 2 days,1 tab for 2 days 06/08/19   Cordell Guercio, Marguerita Beards, PA-C    Family History Family History  Problem Relation Age of Onset  . Thyroid cancer Sister     Social History Social History   Tobacco Use  . Smoking status: Never Smoker  . Smokeless tobacco: Never Used  Substance Use Topics  . Alcohol use: Yes    Alcohol/week: 3.0 standard drinks    Types: 3 Standard drinks or equivalent per week    Comment: socially  . Drug  use: No     Allergies   Tetanus toxoid   Review of Systems Review of Systems  Constitutional: Negative for chills and fever.  Eyes: Negative for pain and visual disturbance.  Respiratory: Negative for cough and shortness of breath.   Cardiovascular: Negative for chest pain and palpitations.  Genitourinary: Negative for dysuria and hematuria.  Musculoskeletal: Positive for neck pain and neck stiffness. Negative for arthralgias, back pain and myalgias.  Skin: Negative for color change and rash.  Neurological: Positive for numbness. Negative for dizziness, seizures, syncope, weakness and headaches.  All other systems reviewed and are negative.    Physical Exam Triage Vital Signs ED Triage Vitals  Enc Vitals Group     BP 06/08/19 1201 (!) 163/99     Pulse Rate 06/08/19 1201 62     Resp 06/08/19 1201 17     Temp 06/08/19 1201 98.4 F (36.9 C)     Temp Source 06/08/19 1201 Oral     SpO2 06/08/19 1201 100 %     Weight --      Height --      Head Circumference --      Peak Flow --      Pain Score 06/08/19 1158 4     Pain Loc --      Pain Edu? --      Excl. in [place]? --    No data found.  Updated Vital Signs BP (!) 163/99 (BP Location: Right Arm)   Pulse 62   Temp 98.4 F (36.9 C) (Oral)   Resp 17   SpO2 100%   Visual Acuity Right Eye Distance:   Left Eye Distance:   Bilateral Distance:    Right Eye Near:   Left Eye Near:    Bilateral Near:     Physical Exam Vitals and nursing note reviewed.  Constitutional:      General: She is not in acute distress.    Appearance: She is well-developed. She is not ill-appearing.  HENT:     Head: Normocephalic and atraumatic.  Eyes:     Extraocular Movements: Extraocular movements intact.     Conjunctiva/sclera: Conjunctivae normal.     Pupils: Pupils are equal, round, and reactive to light.  Neck:     Comments: Neck has full range of motion however patient reports pain when rotating neck to right.  There is no midline  tenderness.  Pain is primarily in the C3 -C5 paraspinal locations on the right side.  There is tenderness in trapezius muscles bilaterally Cardiovascular:     Rate and Rhythm: Normal rate.  Pulmonary:     Effort: Pulmonary effort is normal. No  respiratory distress.  Musculoskeletal:     Cervical back: Normal range of motion and neck supple.     Right lower leg: No edema.     Left lower leg: No edema.     Comments: Upper extremity muscle bulk appears normal and equal bilaterally  Skin:    General: Skin is warm and dry.  Neurological:     General: No focal deficit present.     Mental Status: She is alert and oriented to person, place, and time.     Comments: 5/5 strength throughout upper extremities.  Sensation intact bilaterally equally on soft touch exam  Psychiatric:        Mood and Affect: Mood normal.        Behavior: Behavior normal.        Thought Content: Thought content normal.        Judgment: Judgment normal.      UC Treatments / Results  Labs (all labs ordered are listed, but only abnormal results are displayed) Labs Reviewed - No data to display  EKG   Radiology No results found.  Procedures Procedures (including critical care time)  Medications Ordered in UC Medications - No data to display  Initial Impression / Assessment and Plan / UC Course  I have reviewed the triage vital signs and the nursing notes.  Pertinent labs & imaging results that were available during my care of the patient were reviewed by me and considered in my medical decision making (see chart for details).     #Neck pain #Cervical radiculopathy Patient is a 54 year old female presenting with symptoms consistent with cervical radiculopathy.  Given duration of pain and radicular symptoms will start on a steroid taper today and give follow-up with sports medicine instructions.  Flexeril was prescribed for nighttime relief.  Advised patient to use omeprazole due to dual steroid and NSAID  therapies for GI bleed prophylaxis.  Patient understands and agrees with plan. Final Clinical Impressions(s) / UC Diagnoses   Final diagnoses:  Neck pain  Cervical radiculopathy     Discharge Instructions     Take the steroids as prescribed.  Utilize the omeprazole for the entire duration of your steroid therapy and continue for 2 to 3 days following.  Please follow-up with the sports medicine office attached for further evaluation    ED Prescriptions    Medication Sig Dispense Auth. Provider   predniSONE (STERAPRED UNI-PAK 21 TAB) 10 MG (21) TBPK tablet Take by mouth daily. Take 6 tabs by mouth daily  for 2 days, then 5 tabs for 2 days, then 4 tabs for 2 days, then 3 tabs for 2 days, 2 tabs for 2 days,1 tab for 2 days 42 tablet Quade Ramirez, Marguerita Beards, PA-C   omeprazole (PRILOSEC) 20 MG capsule Take for duration of steroid therapy 14 capsule Genesis Novosad, Marguerita Beards, PA-C   cyclobenzaprine (FLEXERIL) 10 MG tablet Take 1 tablet (10 mg total) by mouth at bedtime. 20 tablet Davien Malone, Marguerita Beards, PA-C     PDMP not reviewed this encounter.   Purnell Shoemaker, PA-C 06/08/19 1824

## 2019-06-08 NOTE — Discharge Instructions (Addendum)
Take the steroids as prescribed.  Utilize the omeprazole for the entire duration of your steroid therapy and continue for 2 to 3 days following.  Please follow-up with the sports medicine office attached for further evaluation

## 2019-06-15 ENCOUNTER — Ambulatory Visit: Payer: BC Managed Care – PPO | Admitting: Family Medicine

## 2019-06-15 ENCOUNTER — Encounter: Payer: Self-pay | Admitting: Family Medicine

## 2019-06-15 ENCOUNTER — Other Ambulatory Visit: Payer: Self-pay

## 2019-06-15 VITALS — BP 138/82 | Ht 64.0 in | Wt 140.0 lb

## 2019-06-15 DIAGNOSIS — M5412 Radiculopathy, cervical region: Secondary | ICD-10-CM
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
## 2019-06-15 NOTE — Patient Instructions (Signed)
You have cervical radiculopathy (a pinched nerve in the neck). Naproxen twice a day as you were taking for lupus. Flexeril as needed. Simple range of motion exercises within limits of pain to prevent further stiffness. Consider physical therapy for stretching, exercises, traction, and modalities if this recurs. Heat 15 minutes at a time 3-4 times a day to help with spasms. Watch head position when on computers, texting, when sleeping in bed - should in line with back to prevent further nerve traction and irritation. If not improving we will consider imaging. Follow up with me as needed.

## 2019-06-23 ENCOUNTER — Ambulatory Visit: Payer: BC Managed Care – PPO | Attending: Internal Medicine

## 2019-06-23 DIAGNOSIS — Z23 Encounter for immunization: Secondary | ICD-10-CM

## 2019-06-23 NOTE — Progress Notes (Signed)
   Covid-19 Vaccination Clinic  Name:  Julie Combs    MRN: [id number] DOB: 13-Jun-1965  06/23/2019  [name] was observed post Covid-19 immunization for 15 minutes without incident. She was provided with Vaccine Information Sheet and instruction to access the V-Safe system.   [name] was instructed to call 911 with any severe reactions post vaccine: Marland Kitchen Difficulty breathing  . Swelling of face and throat  . A fast heartbeat  . A bad rash all over body  . Dizziness and weakness   Immunizations Administered    Name Date Dose VIS Date Route   Pfizer COVID-19 Vaccine 06/23/2019  9:00 AM 0.3 mL 03/13/2019 Intramuscular   Manufacturer: [employer]   Lot: [id number]   [place]: [id number]

## 2019-09-10 ENCOUNTER — Ambulatory Visit: Payer: BC Managed Care – PPO | Attending: Obstetrics and Gynecology | Admitting: Physical Therapy

## 2019-09-10 ENCOUNTER — Other Ambulatory Visit: Payer: Self-pay

## 2019-09-10 ENCOUNTER — Encounter: Payer: Self-pay | Admitting: Physical Therapy

## 2019-09-10 DIAGNOSIS — N3942 Incontinence without sensory awareness: Secondary | ICD-10-CM | POA: Insufficient documentation

## 2019-09-10 NOTE — Therapy (Signed)
[facility] [address] [place], [place], [address] Phone: [phone]   Fax:  [phone]  Physical Therapy Evaluation  Patient Details  Name: Julie Combs MRN: [id number] Date of Birth: 13-Aug-1965 No data recorded  Encounter Date: 09/10/2019   PT End of Session - 09/10/19 1422    Visit Number 1           Past Medical History:  Diagnosis Date  . Anxiety   . Arthritis   . Lupus ([place])   . Palpitations    unifocal PVCs on monitor    Past Surgical History:  Procedure Laterality Date  . CPX  03/17/12   FUNCTIONAL STATUS:REDUCED9PEAK VO2=75% OF PREDICTED.CV STATUS:IMPAIRED.SUBOPTIMAL PEAK CV STRESS LOAD.LOW AT IS SUGGESTIVE OF IMPAIRED CARDIAC FUNCTION.PULMONARY STATUS:NOT IMPAIRED.LIMITING FACTOR=CARDIAC + EFFORT.  Marland Kitchen laproscopic surg  2003   for endometriosis  . NASAL SINUS SURGERY  1994  . TRANSTHORACIC ECHOCARDIOGRAM  02/26/12   EF 55%-60%.NORMAL STUDY.    There were no vitals filed for this visit.                    Objective measurements completed on examination: See above findings.      Patient came into therapy. This facility is Hospital based so patient will have to pay full amount. If she goes to a non-hospital based facility her co-pay will be less. Therapist gave her several options in [place] that will be just a co-pay. Patient was thankful for the information.                        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 Palpitations 06/18/2014  . Nonspecific (abnormal) findings on radiological and other examination of body structure 03/14/2009  . ABNORMAL CHEST XRAY 03/14/2009  . LUPUS 03/11/2009  . HEADACHE, CHRONIC  03/11/2009    Earlie Counts, PT 09/10/19 2:22 PM   [facility] [address], [place], [place], [address] Phone: [phone]   Fax:  [phone]  Name: Julie Combs MRN: [id number] Date of Birth: 1965/05/27

## 2020-02-14 ENCOUNTER — Encounter (HOSPITAL_COMMUNITY): Payer: Self-pay

## 2020-02-14 ENCOUNTER — Ambulatory Visit (HOSPITAL_COMMUNITY)
Admission: EM | Admit: 2020-02-14 | Discharge: 2020-02-14 | Disposition: A | Discharge status: Home / Self Care | Payer: BC Managed Care – PPO

## 2020-02-14 ENCOUNTER — Other Ambulatory Visit: Payer: Self-pay

## 2020-02-14 DIAGNOSIS — S61412A Laceration without foreign body of left hand, initial encounter: Secondary | ICD-10-CM

## 2020-02-14 DIAGNOSIS — M79642 Pain in left hand: Secondary | ICD-10-CM | POA: Diagnosis not present

## 2020-02-14 NOTE — ED Provider Notes (Signed)
[place]   MRN: [id number] DOB: 1965-10-22  Subjective:   Julie Combs is a 54 y.o. female presenting for suffering a laceration to her left hand today while using shears.  Patient has not cleaned her wound.  She came straight to our clinic.  Has allergies to tetanus injections and cannot tolerate them.   Current Facility-Administered Medications:  .  0.9 %  sodium chloride infusion, 500 mL, Intravenous, Once, Pyrtle, Lajuan Lines, MD  Current Outpatient Medications:  .  acetaminophen (TYLENOL) 650 MG CR tablet, Take 650 mg by mouth every 8 (eight) hours as needed for pain., Disp: , Rfl:  .  amoxicillin-clavulanate (AUGMENTIN) 875-125 MG tablet, Take 1 tablet by mouth 2 (two) times daily. (Patient not taking: Reported on 06/15/2019), Disp: 14 tablet, Rfl: 0 .  citalopram (CELEXA) 20 MG tablet, Take 20 mg by mouth daily., Disp: , Rfl:  .  cyclobenzaprine (FLEXERIL) 10 MG tablet, Take 1 tablet (10 mg total) by mouth at bedtime., Disp: 20 tablet, Rfl: 0 .  Doxylamine Succinate, Sleep, (SLEEP AID PO), Take by mouth as needed., Disp: , Rfl:  .  leflunomide (ARAVA) 20 MG tablet, Take 20 mg by mouth daily., Disp: , Rfl:  .  omeprazole (PRILOSEC) 20 MG capsule, Take for duration of steroid therapy, Disp: 14 capsule, Rfl: 0 .  predniSONE (STERAPRED UNI-PAK 21 TAB) 10 MG (21) TBPK tablet, Take by mouth daily. Take 6 tabs by mouth daily  for 2 days, then 5 tabs for 2 days, then 4 tabs for 2 days, then 3 tabs for 2 days, 2 tabs for 2 days,1 tab for 2 days, Disp: 42 tablet, Rfl: 0 .  VITAMIN D, CHOLECALCIFEROL, PO, Take 5,000 Units by mouth daily. , Disp: , Rfl:    Allergies  Allergen Reactions  . Tetanus Toxoid     Swelling, high fever    Past Medical History:  Diagnosis Date  . Anxiety   . Arthritis   . Lupus ([place])   . 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 CPX  03/17/12   FUNCTIONAL STATUS:REDUCED9PEAK  VO2=75% OF PREDICTED.CV STATUS:IMPAIRED.SUBOPTIMAL PEAK CV STRESS LOAD.LOW AT IS SUGGESTIVE OF IMPAIRED CARDIAC FUNCTION.PULMONARY STATUS:NOT IMPAIRED.LIMITING FACTOR=CARDIAC + EFFORT.  [facility] laproscopic surg  2003   for endometriosis  . NASAL SINUS SURGERY  1994  . TRANSTHORACIC ECHOCARDIOGRAM  02/26/12   EF 55%-60%.NORMAL STUDY.    Family History  Problem Relation Age of Onset  . Thyroid cancer Sister     Social History   Tobacco Use  . Smoking status: Never Smoker  . Smokeless tobacco: Never Used  Substance Use Topics  . Alcohol use: Yes    Alcohol/week: 3.0 standard drinks    Types: 3 Standard drinks or equivalent per week    Comment: socially  . Drug use: No    ROS   Objective:   Vitals: BP (!) 152/84 (BP Location: Right Arm)   Pulse 77   Temp 98.9 F (37.2 C) (Oral)   Resp 16   SpO2 100%   Physical Exam Constitutional:      General: She is not in acute distress.    Appearance: Normal appearance. She is well-developed. She is not ill-appearing.  HENT:     Head: Normocephalic and atraumatic.     Nose: Nose normal.     Mouth/Throat:     Mouth: Mucous membranes are moist.  Pharynx: Oropharynx is clear.  Eyes:     General: No scleral icterus.    Extraocular Movements: Extraocular movements intact.     Pupils: Pupils are equal, round, and reactive to light.  Cardiovascular:     Rate and Rhythm: Normal rate.  Pulmonary:     Effort: Pulmonary effort is normal.  Musculoskeletal:       Hands:  Skin:    General: Skin is warm and dry.  Neurological:     General: No focal deficit present.     Mental Status: She is alert and oriented to person, place, and time.  Psychiatric:        Mood and Affect: Mood normal.        Behavior: Behavior normal.     PROCEDURE NOTE: laceration repair Verbal consent obtained from patient.  Local anesthesia with 2cc Lidocaine 1% with epinephrine.  Wound explored for tendon, ligament damage. Wound scrubbed with soap and water  and rinsed. Wound closed with #3 4-0 Prolene (simple interrupted) sutures.  Wound cleansed and dressed.   Assessment and Plan :   PDMP not reviewed this encounter.  1. Left hand pain   2. Laceration of left hand without foreign body, initial encounter     Laceration repaired successfully. Wound care reviewed. Return-to-clinic precautions discussed, patient verbalized understanding. Otherwise, follow up in 7 to 10 days for suture removal.     Jaynee Eagles, PA-C 02/14/20 1757

## 2020-02-14 NOTE — ED Triage Notes (Signed)
Pt present left hand/thumb laceration. Pt has a tetanus allergy.    Pt states she cut her hand with pruning shears.

## 2020-02-14 NOTE — Discharge Instructions (Signed)

## 2021-03-02 ENCOUNTER — Other Ambulatory Visit: Payer: Self-pay | Admitting: Rheumatology

## 2021-03-09 ENCOUNTER — Other Ambulatory Visit: Payer: Self-pay | Admitting: Rheumatology

## 2021-03-09 DIAGNOSIS — R229 Localized swelling, mass and lump, unspecified: Secondary | ICD-10-CM

## 2021-04-04 ENCOUNTER — Ambulatory Visit
Admission: RE | Admit: 2021-04-04 | Discharge: 2021-04-04 | Disposition: A | Discharge status: Home / Self Care | Payer: BC Managed Care – PPO | Source: Ambulatory Visit | Attending: Rheumatology | Admitting: Rheumatology

## 2021-04-04 ENCOUNTER — Other Ambulatory Visit: Payer: Self-pay | Admitting: Rheumatology

## 2021-04-04 DIAGNOSIS — R229 Localized swelling, mass and lump, unspecified: Secondary | ICD-10-CM

## 2021-07-27 ENCOUNTER — Other Ambulatory Visit: Payer: Self-pay | Admitting: Rheumatology

## 2021-07-27 DIAGNOSIS — M5412 Radiculopathy, cervical region: Secondary | ICD-10-CM

## 2021-08-05 ENCOUNTER — Ambulatory Visit
Admission: RE | Admit: 2021-08-05 | Discharge: 2021-08-05 | Disposition: A | Discharge status: Home / Self Care | Payer: BC Managed Care – PPO | Source: Ambulatory Visit | Attending: Rheumatology | Admitting: Rheumatology

## 2021-08-05 DIAGNOSIS — M5412 Radiculopathy, cervical region: Secondary | ICD-10-CM

## 2022-02-13 ENCOUNTER — Other Ambulatory Visit: Payer: Self-pay | Admitting: Obstetrics and Gynecology

## 2022-02-13 DIAGNOSIS — R928 Other abnormal and inconclusive findings on diagnostic imaging of breast: Secondary | ICD-10-CM

## 2022-03-01 ENCOUNTER — Ambulatory Visit
Admission: RE | Admit: 2022-03-01 | Discharge: 2022-03-01 | Disposition: A | Discharge status: Home / Self Care | Payer: BC Managed Care – PPO | Source: Ambulatory Visit | Attending: Obstetrics and Gynecology | Admitting: Obstetrics and Gynecology

## 2022-03-01 ENCOUNTER — Ambulatory Visit: Payer: BC Managed Care – PPO

## 2022-03-01 DIAGNOSIS — R928 Other abnormal and inconclusive findings on diagnostic imaging of breast: Secondary | ICD-10-CM

## 2022-05-08 IMAGING — MR MR CERVICAL SPINE W/O CM
5 series · 35 of 48 positions shown · non-contrast
Comparison: None

CLINICAL DATA: Cervical radiculopathy; technologist note states
neck pain with stiffness for 6 months, notes right facial and arm
numbness and tingling.

EXAM:
MRI CERVICAL SPINE WITHOUT CONTRAST
TECHNIQUE: Multiplanar, multisequence MR imaging of the cervical spine was
performed. No intravenous contrast was administered.

[Series 2: T2 · sagittal · 3.0mm · 0.41mm/px · 8 of 15 slices shown (1 of 2)]
[im 1/15]
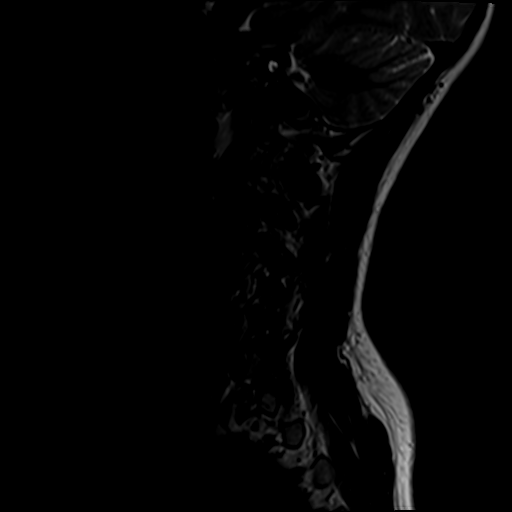
[im 3/15]
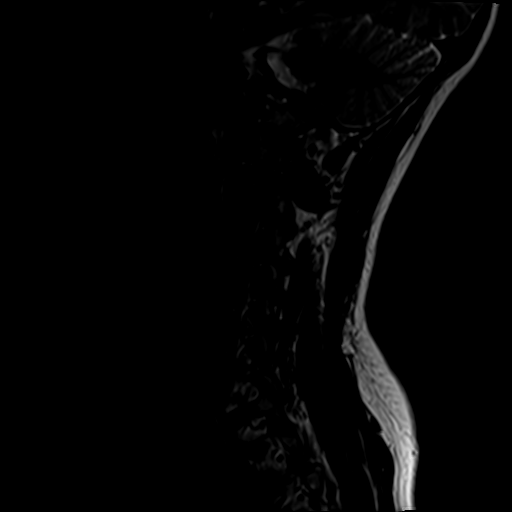
[im 5/15]
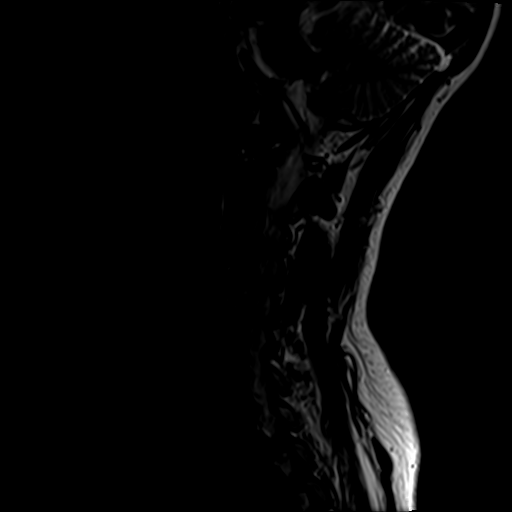
[im 7/15]
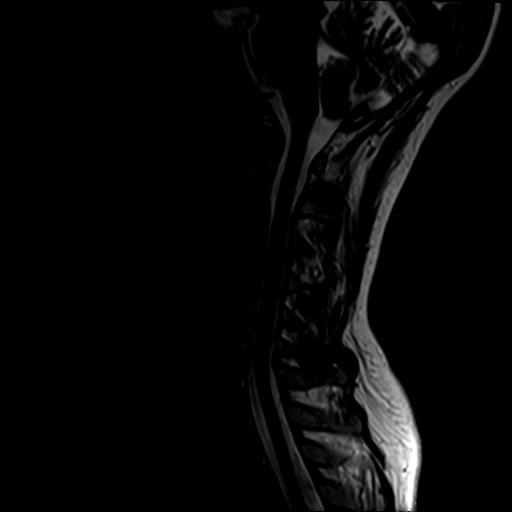
[im 9/15]
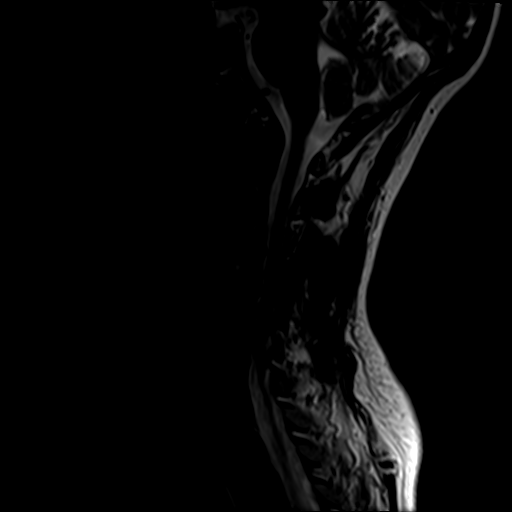
[im 11/15]
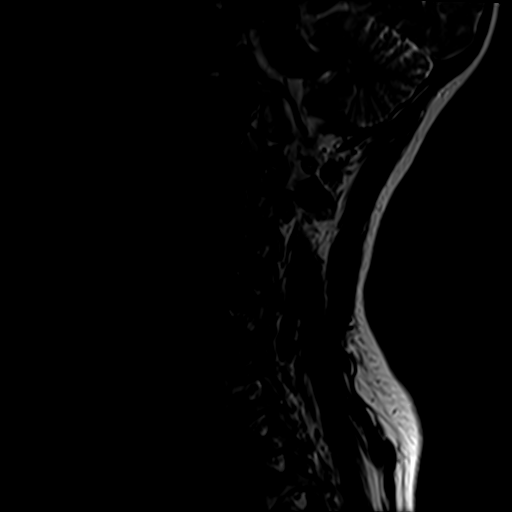
[im 13/15]
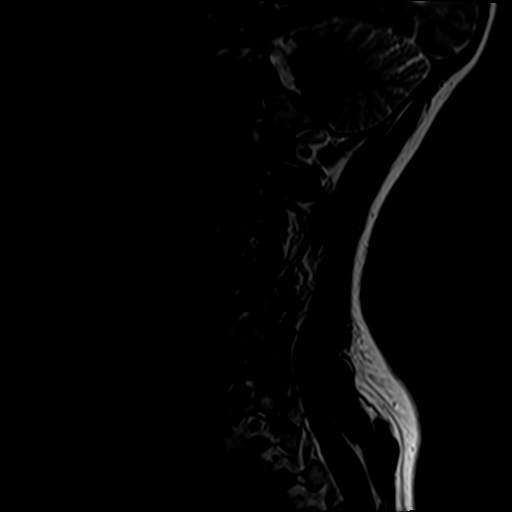
[im 15/15]
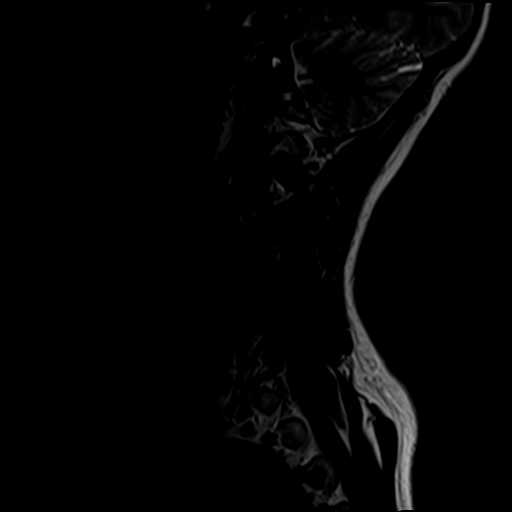

[Series 3: STIR · sagittal · 3.0mm · 0.82mm/px · 7 of 15 slices shown]
[im 1/15]
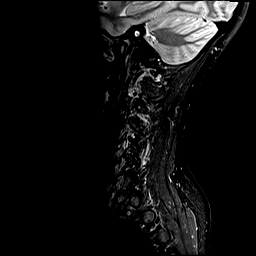
[im 3/15]
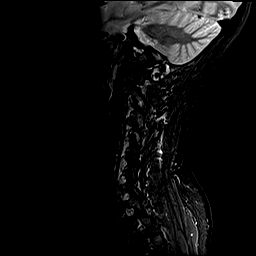
[im 5/15]
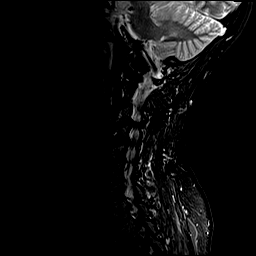
[im 8/15]
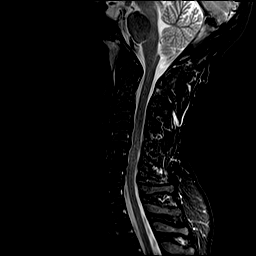
[im 10/15]
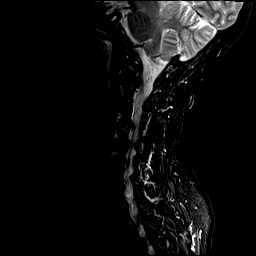
[im 12/15]
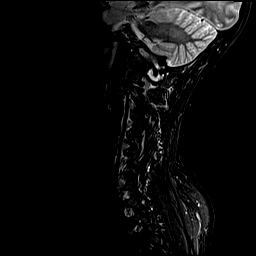
[im 15/15]
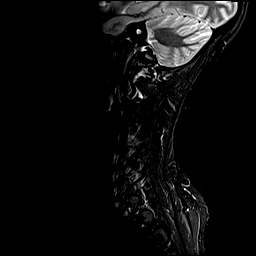

[Series 4: T1 · sagittal · 3.0mm · 0.82mm/px · 7 of 15 slices shown]
[im 1/15]
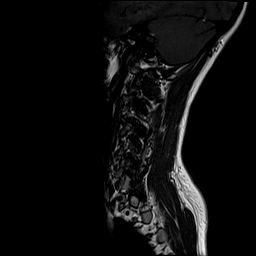
[im 3/15]
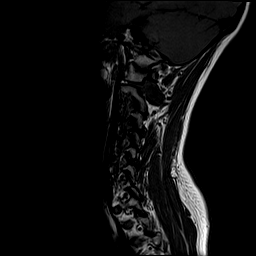
[im 5/15]
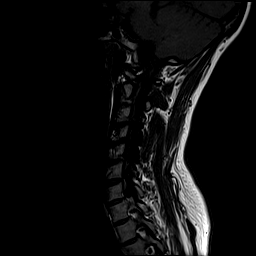
[im 8/15]
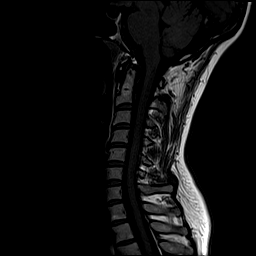
[im 10/15]
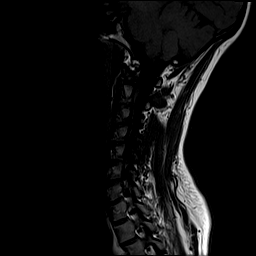
[im 12/15]
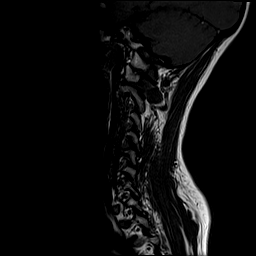
[im 15/15]
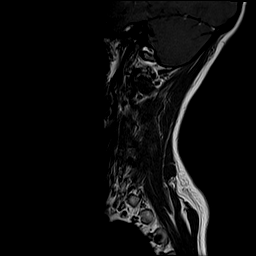

[Series 5: T2 · axial · 3.0mm · 0.70mm/px · z∈[-52,+39]mm · 9 of 26 slices shown (2 of 2)]
[im 1/26]
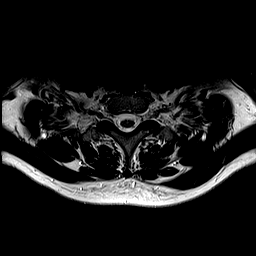
[im 5/26]
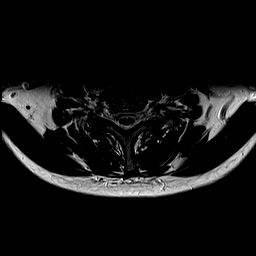
[im 9/26]
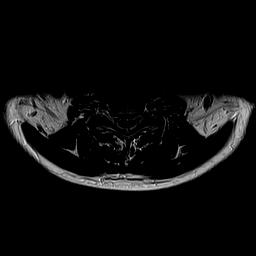
[im 11/26]
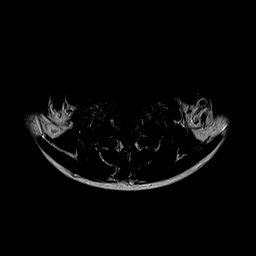
[im 13/26]
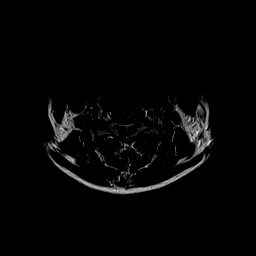
[im 15/26]
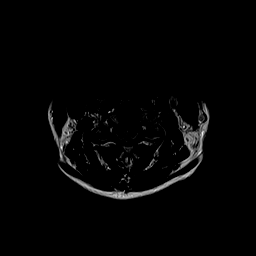
[im 17/26]
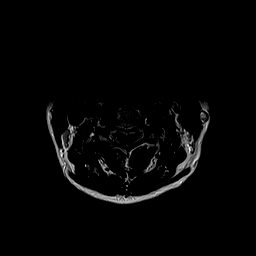
[im 21/26]
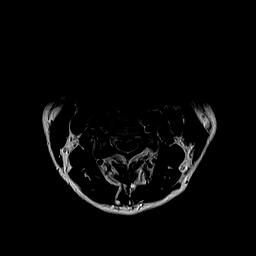
[im 26/26]
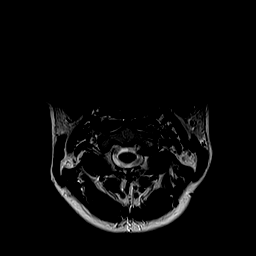

[Series 6: GRE · axial · 3.0mm · 0.35mm/px · z∈[-52,-15]mm · 4 of 26 slices shown]
[im 1/26]
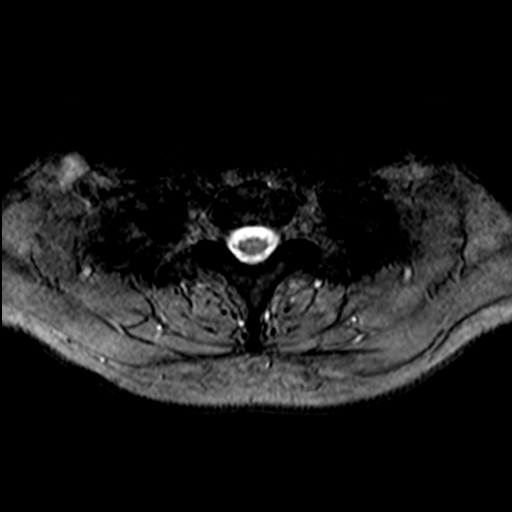
[im 5/26]
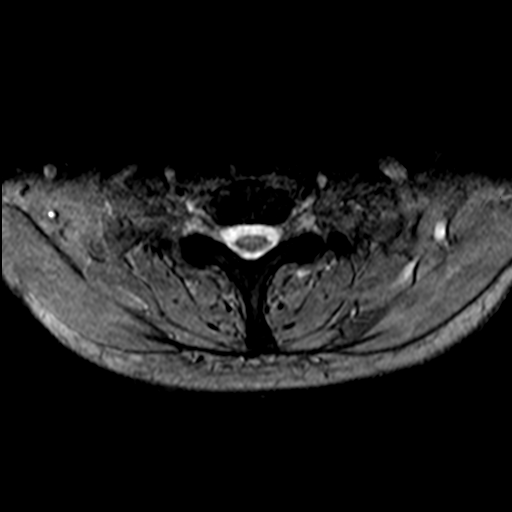
[im 9/26]
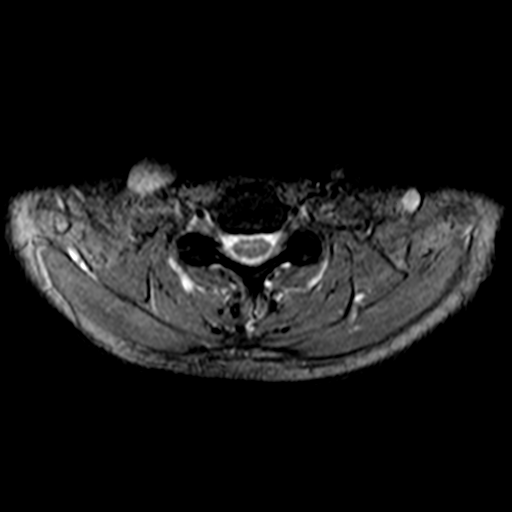
[im 11/26]
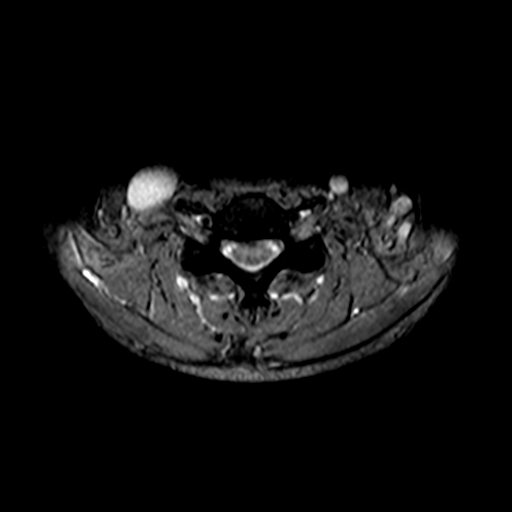

[35 of 48 positions shown; findings below may reference images not displayed]

FINDINGS: Alignment: No significant listhesis.

Vertebrae: Vertebral body heights are maintained. No marrow edema.
No suspicious osseous lesion.

Cord: No abnormal signal.

Posterior Fossa, vertebral arteries, paraspinal tissues: Partially
empty sella is probably incidental. Otherwise unremarkable.

Disc levels:

C2-C3:  No canal or foraminal stenosis.

C3-C4: Uncovertebral and facet hypertrophy, right greater than left.
No canal or left foraminal stenosis. Mild to moderate right
foraminal stenosis.

C4-C5: Disc bulge slightly eccentric to the right. Right
uncovertebral hypertrophy. No canal or left foraminal stenosis. Mild
to moderate right foraminal stenosis.

C5-C6:  Trace disc bulge.  No canal or foraminal stenosis.

C6-C7:  No canal or foraminal stenosis.

C7-T1:  No canal or foraminal stenosis.
IMPRESSION: Mild multilevel degenerative changes as detailed above. There is
right foraminal narrowing at C3-C4 and C4-C5.

## 2022-05-18 ENCOUNTER — Ambulatory Visit (HOSPITAL_BASED_OUTPATIENT_CLINIC_OR_DEPARTMENT_OTHER)
Admission: RE | Admit: 2022-05-18 | Discharge: 2022-05-18 | Disposition: A | Discharge status: Home / Self Care | Payer: BC Managed Care – PPO | Source: Ambulatory Visit | Attending: Registered Nurse | Admitting: Registered Nurse

## 2022-05-18 ENCOUNTER — Other Ambulatory Visit (HOSPITAL_COMMUNITY): Payer: Self-pay | Admitting: Registered Nurse

## 2022-05-18 DIAGNOSIS — R0902 Hypoxemia: Secondary | ICD-10-CM | POA: Insufficient documentation

## 2022-05-18 DIAGNOSIS — R0602 Shortness of breath: Secondary | ICD-10-CM

## 2022-05-18 MED ORDER — IOHEXOL 350 MG/ML SOLN
75.0000 mL | Freq: Once | INTRAVENOUS | Status: AC | PRN
  Administered 2022-05-18: 75 mL via INTRAVENOUS

## 2022-06-12 ENCOUNTER — Institutional Professional Consult (permissible substitution): Payer: BC Managed Care – PPO | Admitting: Pulmonary Disease

## 2022-06-19 NOTE — Progress Notes (Unsigned)
Synopsis: Referred for DOE by Holland Commons, FNP  Subjective:   PATIENT ID: Julie Combs GENDER: female DOB: Mar 29, 1966, MRN: [id number]  No chief complaint on file.  57yF with history of SLE, PVCs, arthritis referred for DOE  Otherwise pertinent review of systems is negative.  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   laproscopic surg  2003   for endometriosis   NASAL SINUS SURGERY  1994   TRANSTHORACIC ECHOCARDIOGRAM  02/26/12   EF 55%-60%.NORMAL STUDY.    Social History   Socioeconomic History   Marital status: Married    Spouse name: Not on file   Number of children: Not on file   Years of education: Not on file   Highest education level: Not on file  Occupational History   Not on file  Tobacco Use   Smoking status: Never   Smokeless tobacco: Never  Substance and Sexual Activity   Alcohol use: Yes    Alcohol/week: 3.0 standard drinks of alcohol    Types: 3 Standard drinks or equivalent per week    Comment: socially   Drug use: No   Sexual activity: Not on file  Other Topics Concern   Not on file  Social History Narrative   Not on file   Social Determinants of Health   Financial Resource Strain: Not on file  Food Insecurity: Not on file  Transportation Needs: Not on file  Physical Activity: Not on file  Stress: Not on file  Social Connections: Not on file  Intimate Partner Violence: Not on file     Allergies  Allergen Reactions   Tetanus Toxoid     Swelling, high fever     Outpatient  Medications Prior to Visit  Medication Sig Dispense Refill   acetaminophen (TYLENOL) 650 MG CR tablet Take 650 mg by mouth every 8 (eight) hours as needed for pain.     amoxicillin-clavulanate (AUGMENTIN) 875-125 MG tablet Take 1 tablet by mouth 2 (two) times daily. (Patient not taking: Reported on 06/15/2019) 14 tablet 0   citalopram (CELEXA) 20 MG tablet Take 20 mg by mouth daily.     cyclobenzaprine (FLEXERIL) 10 MG tablet Take 1 tablet (10 mg total) by mouth at bedtime. 20 tablet 0   Doxylamine Succinate, Sleep, (SLEEP AID PO) Take by mouth as needed.     leflunomide (ARAVA) 20 MG tablet Take 20 mg by mouth daily.     omeprazole (PRILOSEC) 20 MG capsule Take for duration of steroid therapy 14 capsule 0   predniSONE (STERAPRED UNI-PAK 21 TAB) 10 MG (21) TBPK tablet Take by mouth daily. Take 6 tabs by mouth daily  for 2 days, then 5 tabs for 2 days, then 4 tabs for 2 days, then 3 tabs for 2 days, 2 tabs for 2 days,1 tab for 2 days 42 tablet 0   VITAMIN D, CHOLECALCIFEROL, PO Take 5,000 Units by mouth daily.      Facility-Administered Medications Prior to Visit  Medication Dose Route Frequency Provider Last Rate Last Admin  0.9 %  sodium chloride infusion  500 mL Intravenous Once Pyrtle, Lajuan Lines, MD           Objective:   Physical Exam:  General appearance: 57 y.o., female, NAD, conversant  Eyes: anicteric sclerae; PERRL, tracking appropriately HENT: NCAT; MMM Neck: Trachea midline; no lymphadenopathy, no JVD Lungs: CTAB, no crackles, no wheeze, with normal respiratory effort CV: RRR, no murmur  Abdomen: Soft, non-tender; non-distended, BS present  Extremities: No peripheral edema, warm Skin: Normal turgor and texture; no rash Psych: Appropriate affect Neuro: Alert and oriented to person and place, no focal deficit     There were no vitals filed for this visit.   on *** LPM *** RA BMI Readings from Last 3 Encounters:  06/15/19 58.03 kg/m  06/18/17 58.14 kg/m  06/12/17  58.52 kg/m   Wt Readings from Last 3 Encounters:  06/15/19 140 lb (58 kg)  06/18/17 129 lb (58 kg)  06/12/17 131 lb 3.2 oz (58 kg)     CBC    Component Value Date/Time   WBC 5.4 04/15/2014 1422   WBC 5.0 01/26/2010 1145   RBC 4.43 04/15/2014 1422   RBC 3.58 (L) 01/26/2010 1145   HGB 13.1 04/15/2014 1422   HGB 12.4 01/26/2010 1145   HCT 40.9 04/15/2014 1422   HCT 35.5 (L) 01/26/2010 1145   PLT 199 01/26/2010 1145   MCV 92.3 04/15/2014 1422   MCH 29.7 04/15/2014 1422   MCH 34.5 (H) 01/26/2010 1145   MCHC 32.1 04/15/2014 1422   MCHC 34.8 01/26/2010 1145   RDW 13.3 01/26/2010 1145   LYMPHSABS 1.5 06/14/2007 1720   MONOABS 0.7 06/14/2007 1720   EOSABS 0.1 06/14/2007 1720   BASOSABS 0.0 06/14/2007 1720    ***  Chest Imaging: CTA Chest 05/18/22 unremarkable    Pulmonary Functions Testing Results:     No data to display         CPEX 2013 borderline submaximal effort RER 1.04, BR 61%, normal VE-VCO2 slope, max vo2 75% pred - low, borderline low O2 pulse, normal spiro  FeNO: ***  Pathology: ***  Echocardiogram 2013:  Essentially normal  Heart Catheterization: ***    Assessment & Plan:    Plan:      Maryjane Hurter, MD [facility] 06/19/2022 11:11 AM

## 2022-06-20 ENCOUNTER — Ambulatory Visit: Payer: BC Managed Care – PPO | Admitting: Student

## 2022-06-20 ENCOUNTER — Encounter: Payer: Self-pay | Admitting: Student

## 2022-06-20 VITALS — BP 144/86 | HR 75 | Temp 98.3°F | Ht 64.0 in | Wt 146.0 lb

## 2022-06-20 DIAGNOSIS — R053 Chronic cough: Secondary | ICD-10-CM

## 2022-06-20 MED ORDER — ALBUTEROL SULFATE HFA 108 (90 BASE) MCG/ACT IN AERS: 2.0000 | INHALATION_SPRAY | Freq: Four times a day (QID) | RESPIRATORY_TRACT | 6 refills | Status: AC | PRN

## 2022-06-20 NOTE — Patient Instructions (Signed)
-   labs today - during pollen season can use nonsedating antihistamine and take shower, clear nose of crusting then use flonase 1 spray each nostril daily for at least a few weeks at a time  - can try working on lifestyle measures to limit reflux or consider 8 week trial of medication like omeprazole or esomeprazole (would suggest 40 mg dose taken 30 minutes before dinner) since LPR is such a common cause of chronic cough - PFTs next visit in 8 weeks

## 2022-06-21 LAB — IGG, IGA, IGM
IgG (Immunoglobin G), Serum: 1027 mg/dL (ref 600–1640)
IgM, Serum: 218 mg/dL (ref 50–300)
Immunoglobulin A: 201 mg/dL (ref 47–310)

## 2022-06-21 LAB — CBC WITH DIFFERENTIAL/PLATELET
Basophils Absolute: 0.1 10*3/uL (ref 0.0–0.1)
Basophils Relative: 1.4 % (ref 0.0–3.0)
Eosinophils Absolute: 0.1 10*3/uL (ref 0.0–0.7)
Eosinophils Relative: 1.8 % (ref 0.0–5.0)
HCT: 40.1 % (ref 36.0–46.0)
Hemoglobin: 13.7 g/dL (ref 12.0–15.0)
Lymphocytes Relative: 27.1 % (ref 12.0–46.0)
Lymphs Abs: 1.3 10*3/uL (ref 0.7–4.0)
MCHC: 34 g/dL (ref 30.0–36.0)
MCV: 93.2 fl (ref 78.0–100.0)
Monocytes Absolute: 0.4 10*3/uL (ref 0.1–1.0)
Monocytes Relative: 7.8 % (ref 3.0–12.0)
Neutro Abs: 3.1 10*3/uL (ref 1.4–7.7)
Neutrophils Relative %: 61.9 % (ref 43.0–77.0)
Platelets: 307 10*3/uL (ref 150.0–400.0)
RBC: 4.31 Mil/uL (ref 3.87–5.11)
RDW: 13.1 % (ref 11.5–15.5)
WBC: 5 10*3/uL (ref 4.0–10.5)

## 2022-06-21 LAB — IGE: IgE (Immunoglobulin E), Serum: 3 kU/L (ref <=114)

## 2022-08-13 NOTE — Progress Notes (Deleted)
Synopsis: Referred for DOE by Merri Brunette, MD  Subjective:   PATIENT ID: Julie Combs GENDER: female DOB: January 20, 1966, MRN: [id number]  No chief complaint on file.  57yF with history of SLE on plaquenil sees Dr. Deanne Coffer (has arthritis, fatigue, rashes in past - she feels SLE activity has been pretty quiescent), PVCs, arthritis referred for DOE  She has had cough for a really long time, worse since she had covid-19 12/2021. She has had recurrent bouts of bronchitis treated with ABX, steroids with improvement typically - last course of ABX/steroids was 05/18/22. O2 would decline to low 90s when she reclined.   She does have some sinus congestion/PND today but not really typically. She has had sinus surgery before in distant past. No obvious acid reflux/heratburn. Last time she was sick she did use an inhaler - albuterol and she thought it was helpful. She has never tried a steroid containing inhaler.   No dyspnea presently. Mild DOE wonders if she may be deconditioned - though when she had active bronchitis felt quite a bit more dyspneic.  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 She never smoked, no vaping, MJ. Some secondhand exposure growing up. She has lived in [place], [place], [place], [place], [place].   Interval HPI Suggested ppi, flonase last visit, nonsedating antihistamine  PFT   Otherwise pertinent review of systems is negative.  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   laproscopic surg  2003   for endometriosis   NASAL SINUS SURGERY  1994   TRANSTHORACIC ECHOCARDIOGRAM  02/26/12   EF 55%-60%.NORMAL STUDY.    Social History   Socioeconomic History   Marital status: Married    Spouse name: Not on file   Number of children: Not on file   Years of education: Not on file   Highest education level: Not on file  Occupational History   Not on file  Tobacco Use   Smoking status: Never    Passive exposure: Past   Smokeless tobacco: Never  Vaping Use   Vaping Use: Never used  Substance and Sexual Activity   Alcohol use: Yes    Alcohol/week: 3.0 standard drinks of alcohol    Types: 3 Standard drinks or equivalent per week    Comment: socially   Drug use: No   Sexual activity: Not on file  Other Topics Concern   Not on file  Social History Narrative   Not on file   Social Determinants of Health   Financial Resource Strain: Not on file  Food Insecurity: Not on file  Transportation Needs: Not on file  Physical Activity: Not on file  Stress: Not on file  Social Connections: Not on file  Intimate Partner Violence: Not on file     Allergies  Allergen Reactions   Tetanus Toxoid     Swelling, high fever  Outpatient Medications Prior to Visit  Medication Sig Dispense Refill   albuterol (VENTOLIN HFA) 108 (90 Base) MCG/ACT inhaler Inhale 2 puffs into the lungs every 6 (six) hours as needed for wheezing or shortness of breath. 8 g 6   citalopram (CELEXA) 20 MG tablet Take 20 mg by mouth daily.     Doxylamine Succinate, Sleep, (SLEEP AID PO) Take by mouth as needed.     hydroxychloroquine (PLAQUENIL) 200 MG tablet Take 300 mg by mouth daily.     naproxen sodium (ALEVE) 220 MG tablet Take 220 mg by mouth 2 (two) times daily with a meal.     Vitamin D, Ergocalciferol, (DRISDOL) 1.25 MG (50000 UNIT) CAPS capsule Take 50,000 Units by mouth  every 7 (seven) days.     Facility-Administered Medications Prior to Visit  Medication Dose Route Frequency Provider Last Rate Last Admin   0.9 %  sodium chloride infusion  500 mL Intravenous Once Pyrtle, Carie Caddy, MD           Objective:   Physical Exam:  General appearance: 57 y.o., female, NAD, conversant, female, NAD, conversant  Eyes: anicteric sclerae; PERRL, tracking appropriately HENT: NCAT; MMM Neck: Trachea midline; no lymphadenopathy, no JVD Lungs: CTAB, no crackles, no wheeze, with normal respiratory effort CV: RRR, no murmur  Abdomen: Soft, non-tender; non-distended, BS present  Extremities: No peripheral edema, warm Skin: Normal turgor and texture; no rash Psych: Appropriate affect Neuro: Alert and oriented to person and place, no focal deficit     There were no vitals filed for this visit.    on \\RA  BMI Readings from Last 3 Encounters:  06/20/22 25.06 kg/m  06/15/19 24.03 kg/m  06/18/17 22.14 kg/m   Wt Readings from Last 3 Encounters:  06/20/22 146 lb (66.2 kg)  06/15/19 140 lb (63.5 kg)  06/18/17 129 lb (58.5 kg)     CBC    Component Value Date/Time   WBC 5.0 06/20/2022 1617   RBC 4.31 06/20/2022 1617   HGB 13.7 06/20/2022 1617   HCT 40.1 06/20/2022 1617   PLT 307.0 06/20/2022 1617   MCV 93.2 06/20/2022 1617   MCV 92.3 04/15/2014 1422   MCH 29.7 04/15/2014 1422   MCH 34.5 (H) 01/26/2010 1145   MCHC 34.0 06/20/2022 1617   RDW 13.1 06/20/2022 1617   LYMPHSABS 1.3 06/20/2022 1617   MONOABS 0.4 06/20/2022 1617   EOSABS 0.1 06/20/2022 1617   BASOSABS 0.1 06/20/2022 1617     Chest Imaging: CTA Chest 05/18/22 unremarkable save for bronchial wall thickening    Pulmonary Functions Testing Results:     No data to display         CPEX 2013 borderline submaximal effort RER 1.04, BR 61%, normal VE-VCO2 slope, max vo2 75% pred - low, borderline low O2 pulse, normal spiro   Echocardiogram 2013:  Essentially normal      Assessment & Plan:   # Chronic  cough # Recurrent bronchitis She's actually doing much better from this standpoint now. Not sure if she has underlying asthma or covid-19 related small airways disease. While sinus congestion/PND may play role in cough, these have not been active issues for her though she anticipates it may be worse during pollen season. LPR/irritable larynx possible as well though no overt reflux sx. No evidence of ILD or bronchiectasis on CTA Chest 05/18/22.  # History of SLE  Plan: - cbc/diff, IgE, Ig levels - during pollen season can use nonsedating antihistamine and take shower, clear nose of crusting then use  flonase 1 spray each nostril daily for at least a few weeks at a time  - can try working on lifestyle measures to limit reflux or consider 8 week trial of medication like omeprazole or esomeprazole (would suggest 40 mg dose taken 30 minutes before dinner) since LPR is such a common cause of chronic cough - PFTs next visit in 8 weeks    Omar Person, MD  Pulmonary Critical Care 08/13/2022 12:15 PM

## 2022-08-15 ENCOUNTER — Ambulatory Visit: Payer: BC Managed Care – PPO | Admitting: Student

## 2022-09-23 NOTE — Progress Notes (Deleted)
Synopsis: Referred for DOE by Merri Brunette, MD  Subjective:   PATIENT ID: Julie Combs GENDER: female DOB: 12-31-1965, MRN: [id number]  No chief complaint on file.  56yF with history of SLE on plaquenil sees Dr. Deanne Coffer (has arthritis, fatigue, rashes in past - she feels SLE activity has been pretty quiescent), PVCs, arthritis referred for DOE  She has had cough for a really long time, worse since she had covid-19 12/2021. She has had recurrent bouts of bronchitis treated with ABX, steroids with improvement typically - last course of ABX/steroids was 05/18/22. O2 would decline to low 90s when she reclined.   She does have some sinus congestion/PND today but not really typically. She has had sinus surgery before in distant past. No obvious acid reflux/heratburn. Last time she was sick she did use an inhaler - albuterol and she thought it was helpful. She has never tried a steroid containing inhaler.   No dyspnea presently. Mild DOE wonders if she may be deconditioned - though when she had active bronchitis felt quite a bit more dyspneic.    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 She never smoked, no vaping, MJ. Some secondhand exposure growing up. She has lived in [place], [place], [place], [place], [place].  Interval HPI  Antihistamine and flonase recommended during pollen season, lifestyle measures for gerd  Otherwise pertinent review of systems is negative.   Past Medical History:  Diagnosis Date   Anxiety    Arthritis    Lupus (HCC)    Palpitations    unifocal PVCs on monitor     Family History  Problem Relation Age of Onset   Thyroid cancer Sister    Lung cancer Maternal Grandfather    Emphysema Maternal Grandfather    Asthma Daughter      Past Surgical History:  Procedure Laterality Date   CPX  03/17/12   FUNCTIONAL STATUS:[id number] VO2=75% OF PREDICTED.CV STATUS:IMPAIRED.SUBOPTIMAL PEAK CV  STRESS LOAD.LOW AT IS SUGGESTIVE OF IMPAIRED CARDIAC FUNCTION.PULMONARY STATUS:NOT IMPAIRED.LIMITING FACTOR=CARDIAC + EFFORT.   laproscopic surg  2003   for endometriosis   NASAL SINUS SURGERY  1994   TRANSTHORACIC ECHOCARDIOGRAM  02/26/12   EF 55%-60%.NORMAL STUDY.    Social History   Socioeconomic History   Marital status: Married    Spouse name: Not on file   Number of children: Not on file   Years of education: Not on file   Highest education level: Not on file  Occupational History   Not on file  Tobacco Use   Smoking status: Never    Passive exposure: Past   Smokeless tobacco: Never  Vaping Use   Vaping Use: Never used  Substance and Sexual Activity   Alcohol use: Yes    Alcohol/week: 3.0 standard drinks of alcohol    Types: 3 Standard drinks or equivalent per week    Comment: socially   Drug use: No   Sexual activity: Not on file  Other Topics Concern   Not on file  Social History Narrative   Not on file   Social Determinants of Health   Financial Resource Strain: Not on file  Food Insecurity: Not on file  Transportation Needs: Not on file  Physical Activity: Not on file  Stress: Not on file  Social Connections: Not on file  Intimate Partner Violence: Not on file     Allergies  Allergen Reactions   Tetanus Toxoid  Swelling, high fever     Outpatient Medications Prior to Visit  Medication Sig Dispense Refill   albuterol (VENTOLIN HFA) 108 (90 Base) MCG/ACT inhaler Inhale 2 puffs into the lungs every 6 (six) hours as needed for wheezing or shortness of breath. 8 g 6   citalopram (CELEXA) 20 MG tablet Take 20 mg by mouth daily.     Doxylamine Succinate, Sleep, (SLEEP AID PO) Take by mouth as needed.     hydroxychloroquine (PLAQUENIL) 200 MG tablet Take 300 mg by mouth daily.     naproxen sodium (ALEVE) 220 MG tablet Take 220 mg by mouth 2 (two) times daily with a meal.     Vitamin D, Ergocalciferol, (DRISDOL) 1.25 MG (50000 UNIT) CAPS capsule Take  50,000 Units by mouth every 7 (seven) days.     Facility-Administered Medications Prior to Visit  Medication Dose Route Frequency Provider Last Rate Last Admin   0.9 %  sodium chloride infusion  500 mL Intravenous Once Pyrtle, Carie Caddy, MD           Objective:   Physical Exam:  General appearance: 57 y.o., female, NAD, conversant  Eyes: anicteric sclerae; PERRL, tracking appropriately HENT: NCAT; MMM Neck: Trachea midline; no lymphadenopathy, no JVD Lungs: CTAB, no crackles, no wheeze, with normal respiratory effort CV: RRR, no murmur  Abdomen: Soft, non-tender; non-distended, BS present  Extremities: No peripheral edema, warm Skin: Normal turgor and texture; no rash Psych: Appropriate affect Neuro: Alert and oriented to person and place, no focal deficit     There were no vitals filed for this visit.    on \\RA  BMI Readings from Last 3 Encounters:  06/20/22 25.06 kg/m  06/15/19 24.03 kg/m  06/18/17 22.14 kg/m   Wt Readings from Last 3 Encounters:  06/20/22 146 lb (66.2 kg)  06/15/19 140 lb (63.5 kg)  06/18/17 129 lb (58.5 kg)     CBC    Component Value Date/Time   WBC 5.0 06/20/2022 1617   RBC 4.31 06/20/2022 1617   HGB 13.7 06/20/2022 1617   HCT 40.1 06/20/2022 1617   PLT 307.0 06/20/2022 1617   MCV 93.2 06/20/2022 1617   MCV 92.3 04/15/2014 1422   MCH 29.7 04/15/2014 1422   MCH 34.5 (H) 01/26/2010 1145   MCHC 34.0 06/20/2022 1617   RDW 13.1 06/20/2022 1617   LYMPHSABS 1.3 06/20/2022 1617   MONOABS 0.4 06/20/2022 1617   EOSABS 0.1 06/20/2022 1617   BASOSABS 0.1 06/20/2022 1617     Chest Imaging: CTA Chest 05/18/22 unremarkable save for bronchial wall thickening    Pulmonary Functions Testing Results:     No data to display         CPEX 2013 borderline submaximal effort RER 1.04, BR 61%, normal VE-VCO2 slope, max vo2 75% pred - low, borderline low O2 pulse, normal spiro   Echocardiogram 2013:  Essentially normal      Assessment &  Plan:   # Chronic cough # Recurrent bronchitis She's actually doing much better from this standpoint now. Not sure if she has underlying asthma or covid-19 related small airways disease. While sinus congestion/PND may play role in cough, these have not been active issues for her though she anticipates it may be worse during pollen season. LPR/irritable larynx possible as well though no overt reflux sx. No evidence of ILD or bronchiectasis on CTA Chest 05/18/22.  # History of SLE  Plan: - cbc/diff, IgE, Ig levels - during pollen season can use nonsedating antihistamine and take  shower, clear nose of crusting then use flonase 1 spray each nostril daily for at least a few weeks at a time  - can try working on lifestyle measures to limit reflux or consider 8 week trial of medication like omeprazole or esomeprazole (would suggest 40 mg dose taken 30 minutes before dinner) since LPR is such a common cause of chronic cough - PFTs next visit in 8 weeks    Omar Person, MD  Pulmonary Critical Care 09/23/2022 11:54 AM

## 2022-09-25 ENCOUNTER — Inpatient Hospital Stay (HOSPITAL_COMMUNITY): Admission: RE | Admit: 2022-09-25 | Payer: BC Managed Care – PPO | Source: Ambulatory Visit

## 2022-09-25 ENCOUNTER — Encounter (HOSPITAL_BASED_OUTPATIENT_CLINIC_OR_DEPARTMENT_OTHER): Payer: BC Managed Care – PPO

## 2022-09-25 ENCOUNTER — Ambulatory Visit: Payer: BC Managed Care – PPO | Admitting: Student

## 2022-11-21 ENCOUNTER — Ambulatory Visit (INDEPENDENT_AMBULATORY_CARE_PROVIDER_SITE_OTHER): Payer: BC Managed Care – PPO | Admitting: Internal Medicine

## 2022-11-21 DIAGNOSIS — R053 Chronic cough: Secondary | ICD-10-CM

## 2022-11-21 LAB — PULMONARY FUNCTION TEST
DL/VA % pred: 114 %
DL/VA: 4.89 ml/min/mmHg/L
DLCO cor % pred: 104 %
DLCO cor: 20.24 ml/min/mmHg
DLCO unc % pred: 104 %
DLCO unc: 20.24 ml/min/mmHg
FEF 25-75 Post: 2.53 L/sec
FEF 25-75 Pre: 2.3 L/sec
FEF2575-%Change-Post: 10 %
FEF2575-%Pred-Post: 107 %
FEF2575-%Pred-Pre: 97 %
FEV1-%Change-Post: 2 %
FEV1-%Pred-Post: 97 %
FEV1-%Pred-Pre: 94 %
FEV1-Post: 2.4 L
FEV1-Pre: 2.34 L
FEV1FVC-%Change-Post: 2 %
FEV1FVC-%Pred-Pre: 103 %
FEV6-%Change-Post: 0 %
FEV6-%Pred-Post: 94 %
FEV6-%Pred-Pre: 93 %
FEV6-Post: 2.89 L
FEV6-Pre: 2.87 L
FEV6FVC-%Change-Post: 0 %
FEV6FVC-%Pred-Post: 103 %
FEV6FVC-%Pred-Pre: 102 %
FVC-%Change-Post: 0 %
FVC-%Pred-Post: 90 %
FVC-%Pred-Pre: 90 %
FVC-Post: 2.89 L
FVC-Pre: 2.89 L
Post FEV1/FVC ratio: 83 %
Post FEV6/FVC ratio: 100 %
Pre FEV1/FVC ratio: 81 %
Pre FEV6/FVC Ratio: 99 %
RV % pred: 107 %
RV: 2.01 L
TLC % pred: 100 %
TLC: 4.85 L

## 2022-11-21 NOTE — Progress Notes (Signed)
PFT done today.
